# Patient Record
Sex: Female | Born: 1960 | Race: White | Hispanic: No | Marital: Married | Smoking: Current some day smoker
Health system: Southern US, Community
[De-identification: ages and names within clinical notes are randomized; demographics above are authoritative.]

## PROBLEM LIST (undated history)

## (undated) DIAGNOSIS — F909 Attention-deficit hyperactivity disorder, unspecified type: Secondary | ICD-10-CM

## (undated) DIAGNOSIS — M549 Dorsalgia, unspecified: Secondary | ICD-10-CM

## (undated) DIAGNOSIS — I10 Essential (primary) hypertension: Secondary | ICD-10-CM

## (undated) DIAGNOSIS — F319 Bipolar disorder, unspecified: Secondary | ICD-10-CM

## (undated) DIAGNOSIS — M1990 Unspecified osteoarthritis, unspecified site: Secondary | ICD-10-CM

## (undated) DIAGNOSIS — F419 Anxiety disorder, unspecified: Secondary | ICD-10-CM

## (undated) DIAGNOSIS — F32A Depression, unspecified: Secondary | ICD-10-CM

## (undated) DIAGNOSIS — F329 Major depressive disorder, single episode, unspecified: Secondary | ICD-10-CM

## (undated) DIAGNOSIS — G8929 Other chronic pain: Secondary | ICD-10-CM

## (undated) DIAGNOSIS — E7800 Pure hypercholesterolemia, unspecified: Secondary | ICD-10-CM

## (undated) DIAGNOSIS — J209 Acute bronchitis, unspecified: Secondary | ICD-10-CM

## (undated) HISTORY — PX: TUBAL LIGATION: SHX77

## (undated) HISTORY — PX: ARCUATE KERATECTOMY: SHX212

## (undated) HISTORY — PX: SPINAL CORD STIMULATOR IMPLANT: SHX2422

## (undated) HISTORY — PX: APPENDECTOMY: SHX54

---

## 1998-12-28 ENCOUNTER — Other Ambulatory Visit: Admission: RE | Admit: 1998-12-28 | Discharge: 1998-12-28 | Payer: Self-pay | Admitting: Obstetrics and Gynecology

## 2000-01-29 ENCOUNTER — Ambulatory Visit (HOSPITAL_COMMUNITY): Admission: RE | Admit: 2000-01-29 | Discharge: 2000-01-29 | Payer: Self-pay | Admitting: Family Medicine

## 2000-01-29 ENCOUNTER — Encounter: Payer: Self-pay | Admitting: Family Medicine

## 2001-03-06 ENCOUNTER — Other Ambulatory Visit: Admission: RE | Admit: 2001-03-06 | Discharge: 2001-03-06 | Payer: Self-pay | Admitting: Obstetrics and Gynecology

## 2002-11-20 ENCOUNTER — Other Ambulatory Visit: Admission: RE | Admit: 2002-11-20 | Discharge: 2002-11-20 | Payer: Self-pay | Admitting: Obstetrics and Gynecology

## 2004-02-07 ENCOUNTER — Other Ambulatory Visit: Admission: RE | Admit: 2004-02-07 | Discharge: 2004-02-07 | Payer: Self-pay | Admitting: Obstetrics and Gynecology

## 2005-08-03 ENCOUNTER — Other Ambulatory Visit: Admission: RE | Admit: 2005-08-03 | Discharge: 2005-08-03 | Payer: Self-pay | Admitting: Obstetrics and Gynecology

## 2008-12-10 HISTORY — PX: BACK SURGERY: SHX140

## 2009-02-02 ENCOUNTER — Encounter: Admission: RE | Admit: 2009-02-02 | Discharge: 2009-02-02 | Payer: Self-pay | Admitting: Family Medicine

## 2009-06-20 ENCOUNTER — Inpatient Hospital Stay (HOSPITAL_COMMUNITY): Admission: RE | Admit: 2009-06-20 | Discharge: 2009-06-23 | Payer: Self-pay | Admitting: Neurological Surgery

## 2009-06-20 ENCOUNTER — Ambulatory Visit: Payer: Self-pay | Admitting: Vascular Surgery

## 2011-01-25 ENCOUNTER — Emergency Department (HOSPITAL_COMMUNITY): Payer: 59

## 2011-01-25 ENCOUNTER — Observation Stay (HOSPITAL_COMMUNITY)
Admission: EM | Admit: 2011-01-25 | Discharge: 2011-01-26 | Disposition: A | Discharge status: Home / Self Care | Payer: 59 | Attending: Internal Medicine | Admitting: Internal Medicine

## 2011-01-25 DIAGNOSIS — E785 Hyperlipidemia, unspecified: Secondary | ICD-10-CM | POA: Insufficient documentation

## 2011-01-25 DIAGNOSIS — R079 Chest pain, unspecified: Secondary | ICD-10-CM

## 2011-01-25 DIAGNOSIS — E669 Obesity, unspecified: Secondary | ICD-10-CM | POA: Insufficient documentation

## 2011-01-25 DIAGNOSIS — F411 Generalized anxiety disorder: Secondary | ICD-10-CM | POA: Insufficient documentation

## 2011-01-25 DIAGNOSIS — R110 Nausea: Secondary | ICD-10-CM | POA: Insufficient documentation

## 2011-01-25 DIAGNOSIS — F17200 Nicotine dependence, unspecified, uncomplicated: Secondary | ICD-10-CM | POA: Insufficient documentation

## 2011-01-25 DIAGNOSIS — R0789 Other chest pain: Principal | ICD-10-CM | POA: Insufficient documentation

## 2011-01-25 DIAGNOSIS — F319 Bipolar disorder, unspecified: Secondary | ICD-10-CM | POA: Insufficient documentation

## 2011-01-25 LAB — COMPREHENSIVE METABOLIC PANEL
ALT: 14 U/L (ref 0–35)
AST: 16 U/L (ref 0–37)
Calcium: 9.2 mg/dL (ref 8.4–10.5)
GFR calc Af Amer: >60 mL/min (ref >60)
Sodium: 138 mEq/L (ref 135–145)
Total Protein: 6.9 g/dL (ref 6.0–8.3)

## 2011-01-25 LAB — CBC
HCT: 40.8 % (ref 36.0–46.0)
Hemoglobin: 14.2 g/dL (ref 12.0–15.0)
WBC: 8.3 10*3/uL (ref 4.0–10.5)

## 2011-01-25 LAB — POCT CARDIAC MARKERS
CKMB, poc: <1 ng/mL — ABNORMAL LOW (ref 1.0–8.0)
Myoglobin, poc: 54.9 ng/mL (ref 12–200)

## 2011-01-26 DIAGNOSIS — R079 Chest pain, unspecified: Secondary | ICD-10-CM

## 2011-01-26 LAB — CARDIAC PANEL(CRET KIN+CKTOT+MB+TROPI)
Relative Index: INVALID (ref 0.0–2.5)
Total CK: 108 U/L (ref 7–177)
Troponin I: <0.01 ng/mL (ref 0.00–0.06)

## 2011-01-26 LAB — LIPID PANEL
HDL: 35 mg/dL — ABNORMAL LOW (ref >39)
Total CHOL/HDL Ratio: 5 RATIO

## 2011-01-26 NOTE — H&P (Signed)
NAMEASMARA, BACKS                ACCOUNT NO.:  [id number]  MEDICAL RECORD NO.:  [id number]           PATIENT TYPE:  I  LOCATION:  6526                         FACILITY:  [facility]  PHYSICIAN:  Zacarias Pontes, MD       DATE OF BIRTH:  1961/05/11  DATE OF ADMISSION:  01/25/2011 DATE OF DISCHARGE:                             HISTORY & PHYSICAL   PRIMARY CARDIOLOGIST:  None.  PRIMARY CARE PHYSICIAN:  Dr. Laurann Montana.  CHIEF COMPLAINT:  Chest pain.  HISTORY OF PRESENT ILLNESS:  Melinda Obrien is a pleasant 50 year old female with ongoing tobacco use and pharmacologically controlled bipolar disorder who presents with a 2-3 month history of chest discomfort unrelated to exertion with a worsening today that did not improve for the last several months.  Melinda Obrien describes a chest pain syndrome which has repeatedly awoken her from sleep at night. Prior to today, her symptoms had improved with getting out of bed and ambulating for a certain period of time.  She reports being able to walk on the treadmill for 30 minutes without limitation and she reports the ability to be active when she wants to be.  Her last stint on the treadmill was approximately 2 weeks ago.  She denies any orthopnea or PND.  At approximately 3:00 a.m. on the morning of January 25, 2011,  she woke with central chest tightness.  She sat up and tried to get up to walk it off without success.  She reports having taken an oral benzodiazepine and subsequently falling asleep until around 7:00 a.m..  At around 7:00 a.m. she woke up again with the same tightness which never fully improved over the course of her day.  She denies any associated nausea or diaphoresis.  Her chest pain was unrelated to exertion.  Because of its persistence she presented to the [facility] emergency department for further evaluation.  PAST MEDICAL HISTORY: 1. Anxiety 2. Bipolar disorder managed medically.  She sees a psychiatrist    regularly with whom she has a reportedly good and productive     relationship.  FAMILY HISTORY:  Her father had coronary artery disease late in his life.  Her siblings have hypertension.  No reported history of premature coronary artery disease.  SOCIAL HISTORY:  The patient is married and accompanied by her husband. She is a long-time smoker and continues to actively smoke.  She denies alcohol or illicits.  She is still having regular menstrual periods and cycles.  REVIEW OF SYSTEMS:  As per HPI, otherwise comprehensively unremarkable.  ALLERGIES:  PENICILLIN, SULFA, CODEINE.  MEDICATIONS: 1. Depakote 500 mg twice daily. 2. Ambien 10 mg nightly. 3. Alprazolam 0.5 mg as needed. 4. Vicodin for pain as needed. 5. Lamictal 200 mg daily.  PHYSICAL EXAMINATION:  A comprehensive cardiovascular physical exam was performed. VITAL SIGNS:  She is she was afebrile, blood pressure of 118/60 and a heart rate of 70.  She is breathing comfortably at a respiratory rate of 16, saturating 100% on room air. HEENT:  Notable for is supple neck with no masses or lymphadenopathy. Her JVP appear normal. CARDIAC:  Notable for normal S1, S2 with no murmurs, rubs, or gallops. CHEST:  Notable for lungs that were clear to auscultation bilaterally with good air movement and no crackles present. ABDOMINAL:  Notable for a soft, nontender, nondistended abdomen.  No abdominal bruits were appreciated. EXTREMITIES:  Warm, well-perfused without any lower extremity. NEUROLOGIC:  Notable for patient alert, oriented x3 with no detectable focal neurologic deficits.  EKG demonstrated normal sinus rhythm with no ST-segment changes.  She did have T-wave inversion in lead III and T-wave flattening in aVF.  LABORATORY EVALUATION:  Notable for a CBC which was normal with a white count of 8000, hematocrit of 40 and platelets of 254,000.  Sodium is 138, potassium is 4, chloride 104, bicarbonate 28, BUN 14,  creatinine 0.9 with a glucose of 94.  Her extended chemistries were normal.  First set of cardiac enzymes was normal with a troponin less than 0.05.  IMPRESSION:  This is a 50 year old female with tobacco as a risk factor for coronary artery disease presenting with an atypical chest pain syndrome with no electrocardiographic or enzymatic evidence of acute coronary syndrome.  Though her ongoing tobacco use does put her at risk, her clinical history is atypical and her preliminary assessment is reassuring against the possibility of an unstable syndrome. Nevertheless, the potential acceleration or worsening of her symptoms today warrant careful evaluation.  PLAN:  We will admit Melinda Obrien to our Telemetry floor, as she is now chest painfree.  We will complete her enzyme curve to exclude myocardial infarction. Once myocardial infarction has been completely excluded, further reassurance can be sought via stress testing to exclude any significant presence of inducible ischemia.  Given her reported exercise tolerance in the recent past, it would be worthwhile to stress her via exercise.  Adjunctive stress imaging via echocardiography or nuclear imaging would be reasonable.  Regardless of whether she has inducible ischemia on further assessment, she still stands the benefit from aggressive risk factor modification in the form of smoking cessation counseling, which I began with her.  Given the atypical nature of her chest pain syndrome, noncoronary possibilities must also be considered.  Given the tendency for her chest complaints to occur at night while in the supine position, together with her ongoing tobacco use and generally poor eating habits, this could represent reflux or GI discomfort.  We will treat her empirically with Maalox to see if this settles down her symptoms and potentially prevents her short-term recurrence.          ______________________________ Zacarias Pontes,  MD     DM/MEDQ  D:  01/26/2011  T:  01/26/2011  Job:  [id number] Electronically Signed by Zacarias Pontes MD on 01/26/2011 02:37:13 PM

## 2011-02-10 NOTE — Discharge Summary (Signed)
  Melinda Obrien, Melinda Obrien                ACCOUNT NO.:  [id number]  MEDICAL RECORD NO.:  [id number]           PATIENT TYPE:  I  LOCATION:  6526                         FACILITY:  [facility]  PHYSICIAN:  Melinda Obrien, M.D. DATE OF BIRTH:  September 15, 1961  DATE OF ADMISSION:  01/25/2011 DATE OF DISCHARGE:  01/26/2011                              DISCHARGE SUMMARY   PRIMARY CARE PROVIDER:  Stacie Acres. White, MD  DISCHARGE DIAGNOSIS:  Chest pain without objective evidence of ischemia.  SECONDARY DIAGNOSES: 1. Anxiety. 2. Bipolar disorder. 3. Ongoing tobacco abuse.  ALLERGIES:  PENICILLIN, SULFA, and CODEINE.  PROCEDURES:  Stress echocardiogram during which the patient exercised for total of 7 minutes and 15 seconds achieving a maximal heart rate of 155 beats per minute (91%).  Exercise was limited by leg pain.  ECG showed no acute changes, and echocardiogram imaging showed no evidence for new left ventricular regional wall motion abnormalities, therefore a normal study.  HISTORY OF PRESENT ILLNESS:  A 50 year old female with the above problem list who over a 2-2-month period has been having intermittent chest discomfort, unrelated to exertion that worsened on the day of admission prompting ER visit.  In the ED, ECG showed no acute changes while enzymes were negative.  She was admitted for evaluation.  HOSPITAL COURSE:  The patient ruled out for MI.  She underwent a stress echocardiogram this morning where she walked for 7 minutes and 15 seconds achieving a maximal heart rate of 155 beats per minute.  ECG remained normal and LV function was normal without new wall motion abnormalities.  There was no evidence of inducible stress at maximal exercise and the patient was felt stable for discharge today.  She has been counseled on the importance of smoking cessation.  DISCHARGE LABS:  Hemoglobin 14.2, hematocrit 40.8, WBC 8.3, platelets 254.  Sodium 138, potassium 4.0, chloride 104, CO2 of 28,  BUN 14, creatinine 0.96, glucose 94, total bilirubin 0.1, alkaline phosphatase 37, AST 16, ALT 14, total protein 6.9, albumin 4.3, calcium 9.2.  CK 86, MB 1.1, troponin-I less than 0.01.  Total cholesterol 174, triglycerides 159, HDL 35, LDL 107.  DISPOSITION:  The patient will be discharged home today in good condition.  FOLLOWUP PLANS AND APPOINTMENTS:  The patient is asked to follow up with Dr. Cliffton Asters as previously scheduled.  DISCHARGE MEDICATIONS: 1. Alprazolam 0.5 mg q.i.d. p.r.n. 2. Ambien 10 at bedtime. 3. Depakote ER 500 mg b.i.d. 4. Hydrocodone/APAP 5/500 mg q.12 h. p.r.n. 5. Lamictal 200 mg daily.  OUTSTANDING LAB AND STUDIES:  None.  DURATION OF DISCHARGE ENCOUNTER:  Forty minutes including physician time.     Melinda Obrien, ANP   ______________________________ Melinda Obrien, M.D.    CB/MEDQ  D:  01/26/2011  T:  01/27/2011  Job:  [id number]  cc:   Stacie Acres. Cliffton Asters, M.D.  Electronically Signed by Melinda Obrien ANP on 01/31/2011 02:06:40 PM Electronically Signed by Melinda Obrien M.D. on 02/10/2011 07:54:50 AM

## 2011-02-21 ENCOUNTER — Ambulatory Visit (HOSPITAL_COMMUNITY)
Admission: RE | Admit: 2011-02-21 | Discharge: 2011-02-21 | Disposition: A | Discharge status: Home / Self Care | Payer: 59 | Source: Ambulatory Visit | Attending: Family Medicine | Admitting: Family Medicine

## 2011-02-21 ENCOUNTER — Other Ambulatory Visit: Payer: Self-pay | Admitting: Family Medicine

## 2011-02-21 ENCOUNTER — Other Ambulatory Visit (HOSPITAL_COMMUNITY): Payer: Self-pay | Admitting: Family Medicine

## 2011-02-21 DIAGNOSIS — K7689 Other specified diseases of liver: Secondary | ICD-10-CM | POA: Insufficient documentation

## 2011-02-21 DIAGNOSIS — R1011 Right upper quadrant pain: Secondary | ICD-10-CM | POA: Insufficient documentation

## 2011-02-22 ENCOUNTER — Other Ambulatory Visit: Payer: 59

## 2011-03-18 LAB — TYPE AND SCREEN: ABO/RH(D): A POS

## 2011-03-18 LAB — CBC
HCT: 38.8 % (ref 36.0–46.0)
MCV: 93.9 fL (ref 78.0–100.0)
RBC: 4.14 MIL/uL (ref 3.87–5.11)
WBC: 6.8 10*3/uL (ref 4.0–10.5)

## 2011-03-18 LAB — ABO/RH: ABO/RH(D): A POS

## 2011-04-24 NOTE — Op Note (Signed)
Melinda Obrien, GARMANY                ACCOUNT NO.:  [id number]   MEDICAL RECORD NO.:  [id number]          PATIENT TYPE:  INP   LOCATION:  3010                         FACILITY:  [facility]   PHYSICIAN:  Larina Earthly, M.D.    DATE OF BIRTH:  Feb 25, 1961   DATE OF PROCEDURE:  06/20/2009  DATE OF DISCHARGE:                               OPERATIVE REPORT   PREOPERATIVE DIAGNOSIS:  L4-5 degenerative disk disease.   POSTOPERATIVE DIAGNOSIS:  L4-5 degenerative disk disease.   PROCEDURE:  Anterior exposure for L4-5 disk interbody fusion which will  be dictated as a separate not by Dr. Barnett Abu.   SURGEON:  Larina Earthly, MD   ANESTHESIA:  Endotracheal.   COMPLICATIONS:  None.   DISPOSITION:  To recovery room, stable.   PROCEDURE IN DETAIL:  The patient was taken to the operating room and  placed in supine position where the C-arm was used to mark the level of  L4-5 externally.  The patient was prepped and draped in the usual  sterile fashion.  A transverse incision was made to the left of midline  over the level of L4-5.  The anterior rectus sheath was opened in line  with skin incision and the rectus muscle was mobilized.  Retroperitoneum  was entered laterally and the peritoneal contents were mobilized to the  right.  So, I was quite adherent to the anterior wall and there was  opening of the peritoneum over a moderate segment this was not closed at  the end of the procedure.  The ureter was identified and was mobilized  to the right.  The iliac vessels were identified and the left iliac vein  was mobilized.  The iliolumbar vein was ligated with 2-0 silk ties and  divided to give mobilization.  The iliac vessels were mobilized to the  right as well to give exposure to the L4-5 interbody.  The Brau  retractor was brought into place and the 150 blades were placed medially  and laterally at L4-5 for exposure as well as malleable retractors for  the proximal and distal exposure.  The  remainder of the operation  regarding interbody fusion was dictated as a separate note by Dr.  Danielle Dess.  At the completion of the fusion, the abdominal contents were  returned to their normal position.  There was no evidence of injury to  the artery or veins.  The anterior fascia was closed with running 0  Vicryl suture.  The wounds were irrigated with saline.  The skin was  closed with 3-0 subcuticular Vicryl stitch and Dermabond was placed on  the wound.  The patient tolerated the procedure without immediate  complications and was transferred to the recovery room in stable  condition.      Larina Earthly, M.D.  Electronically Signed    TFE/MEDQ  D:  06/23/2009  T:  06/23/2009  Job:  [id number]

## 2011-04-24 NOTE — Op Note (Signed)
NAMEKIMMERLY, Melinda Obrien                ACCOUNT NO.:  [id number]   MEDICAL RECORD NO.:  [id number]          PATIENT TYPE:  INP   LOCATION:  3010                         FACILITY:  [facility]   PHYSICIAN:  Stefani Dama, M.D.  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   POSTOPERATIVE DIAGNOSIS:  Spondylosis at L4-L5 with back pain and lumbar  radiculopathy.   PROCEDURE:  Anterior lumbar interbody decompression and fusion with PEEK  spacer, allograft bone, and Titanium plate fixation.   SURGEON:  Stefani Dama, MD   APPROACH AND CLOSURE:  Larina Earthly, MD, co-surgeon.   INDICATIONS:  Melinda Obrien is a 50 year old individual who has had  significant back pain with radicular pain.  She has advanced  degenerative changes at L4-L5 with marked spondylitic degeneration and  some bony overgrowth.  She was advised regarding surgical decompression  having failed efforts for prolonged period of time at conservative  measures.   PROCEDURE:  The patient was brought to the operating room and placed on  table in the supine position.  After smooth induction of general  endotracheal anesthesia, the abdomen was prepped with alcohol and  DuraPrep and draped in a sterile fashion.  Dr. Tawanna Cooler Early then completed  the approach to the retroperitoneal space exposing L4-5 which had been  localized positively with radiography.  Once the retractors were in  place, I started my portion of procedure by opening the anterior  longitudinal ligament with a #15 blade and using a combination of  Kerrison rongeurs to evacuate a significant quantity of severely  degenerating desiccated disk material.  As the disk space was evacuated,  the region of the posterior longitudinal ligament came into view.  Here,  osteophyte from the inferior  margin body of L4 was identified and this  was taken up with a 2-mm Kerrison punch.  A high-speed drill was then  used to rongeur smooth the edges of the bone to flatten the endplates.  We initially planned on using LDR instrumentation and then LDR implant  measuring 12 mm in height was chosen with 12 degrees of lordosis.  However, on attempts to place the anterior Titanium plate in the  superior portion of body of L4, we noticed that the plate would advance  only so far and then cracked the ventral aspect of the implant.  This  happened a total of 3 times before it was decided that this procedure  would not be successful.  I then removed the LDR implant and chose an  appropriately size and shaped Synthes SynFix implant.  The implant  measured 12.5 mm in height, 26 x 32 mm in size and had 8 degrees of  lordosis.  The implant fitted well and then 20-mm self-tapping screws  were used to secure the implant into the vertebral body of L4 and the  vertebral body of L5 with the anterior plate fixation to stabilize it.  Once this was completed, the wound was inspected for hemostasis.  The  lateral gutters were packed with a  combination of the Vitoss and Infuse, which had been prepared to place  into the interspace itself and then retractors were removed and the  ventral closure was performed by Dr. Arbie Cookey.  The patient tolerated the  procedure well.  She was returned to the recovery room in stable  condition.  Blood loss was noted to be less than 100 mL.      Stefani Dama, M.D.  Electronically Signed     HJE/MEDQ  D:  07/07/2009  T:  07/07/2009  Job:  [id number]

## 2011-04-27 NOTE — Discharge Summary (Signed)
NAMEKAYANN, MAJ                ACCOUNT NO.:  [id number]   MEDICAL RECORD NO.:  [id number]          PATIENT TYPE:  INP   LOCATION:  3010                         FACILITY:  [facility]   PHYSICIAN:  Stefani Dama, M.D.  DATE OF BIRTH:  1961/10/09   DATE OF ADMISSION:  06/20/2009  DATE OF DISCHARGE:  06/23/2009                               DISCHARGE SUMMARY   ADMITTING DIAGNOSES:  1. Degenerative disk disease and lumbar spondylosis, L4-5.  2. Low back pain.  3. Lower extremity radiculopathy.   OPERATIONS AND PROCEDURES:  Anterior lumbar interbody fusion and  decompression, L4-5.   BRIEF HISTORY AND HOSPITAL COURSE:  Terence Googe is a 50 year old female  with longstanding centralized low back pain with one level of L4-5  degenerative disk disease who failed conservative care.  Her centralized  low back pain and lower extremity pain continued and she elected to  proceed with surgical intervention.  She underwent anterior lumbar  interbody fusion and decompression on June 20, 2009, tolerated the  procedure well, stabilized in recovery room and placed in neurosurgical  ICU postoperatively.  Vital signs remained stable throughout her  hospitalization.  First day postoperatively, the patient was out of bed  and chair, comfortable.  Positive bowel sounds.  Neurovascularly intact.  Distal pulses intact.  Wound benign, no erythema or any signs of  infection.  Motor sensation within normal limits.  Abdomen, soft.  Her a-  line and central line were discontinued.  PCA morphine pump was  discontinued and saline lock started on p.o. Percocet.  Transfer  arrangements were made to transfer her to 3000.  She was started on  Lovenox prophylaxis against DVT 40 mg subcu daily, and Dulcolax  suppository.   DISCHARGE PLANNING:  Her diet was advanced and she was localizing safely  in the halls.  Second day postoperatively, she continued to make good  progress, was voiding well, ambulating safely, and  no leg pain, eating  well.  Wound remained benign.  Arrangements were made for discharge home  on June 23, 2009.  Lovenox home teaching was arranged to work with  physical therapy while at time on bed mobility and safe ambulation.  She  is ready for discharge home on June 23, 2009, eating well and voiding  well.  Ambulating safely.  Neurovascularly intact.   DISCHARGE INSTRUCTIONS:  Discharge home.  Back precautions Aspen  QuickDraw brace.  Lovenox 40 mg subcu daily x7 days.   FOLLOWUP:  Follow up with Dr. Danielle Dess in 3-4 weeks.   MEDICATIONS:  Continue on home medications of:  1. Lamotrigine 200 mg daily.  2. Neurontin 300 mg t.i.d.  3. Alprazolam 0.5 mg q.i.d.  4. On Depakote ER 500 mg 2-3 tablets at bedtime.  5. Ambien 10 mg at bedtime p.r.n. sleep.  6. She is given a prescription for Percocet 1-2 p.o. q.4-6 h. p.r.n.      pain.  7. Valium 5 mg 1 p.o. q.8-12 h. p.r.n. muscle spasm.  8. Lovenox 40 mg subcu daily x7 days.   DISCHARGE CONDITION:  Stable, improved.   DISPOSITION:  Discharge  to home.  Contact our office prior to follow up  if any questions or concerns.      Aura Fey Bobbe Medico.      Stefani Dama, M.D.  Electronically Signed    SCI/MEDQ  D:  07/27/2009  T:  07/27/2009  Job:  [id number]

## 2011-07-25 ENCOUNTER — Other Ambulatory Visit (HOSPITAL_COMMUNITY): Payer: Self-pay | Admitting: Family Medicine

## 2011-07-25 DIAGNOSIS — K7689 Other specified diseases of liver: Secondary | ICD-10-CM

## 2011-08-21 ENCOUNTER — Other Ambulatory Visit (HOSPITAL_COMMUNITY): Payer: 59

## 2011-08-23 ENCOUNTER — Other Ambulatory Visit: Payer: Self-pay | Admitting: Orthopedic Surgery

## 2011-08-23 DIAGNOSIS — M545 Low back pain: Secondary | ICD-10-CM

## 2011-09-10 ENCOUNTER — Ambulatory Visit
Admission: RE | Admit: 2011-09-10 | Discharge: 2011-09-10 | Disposition: A | Discharge status: Home / Self Care | Payer: 59 | Source: Ambulatory Visit | Attending: Orthopedic Surgery | Admitting: Orthopedic Surgery

## 2011-09-10 DIAGNOSIS — M545 Low back pain: Secondary | ICD-10-CM

## 2011-09-10 MED ORDER — IOHEXOL 180 MG/ML  SOLN
18.0000 mL | Freq: Once | INTRAMUSCULAR | Status: AC | PRN
  Administered 2011-09-10: 18 mL via INTRATHECAL

## 2011-09-10 MED ORDER — DIAZEPAM 2 MG PO TABS
10.0000 mg | ORAL_TABLET | Freq: Once | ORAL | Status: AC
  Administered 2011-09-10: 10 mg via ORAL

## 2011-12-20 ENCOUNTER — Emergency Department (HOSPITAL_COMMUNITY)
Admission: EM | Admit: 2011-12-20 | Discharge: 2011-12-20 | Discharge status: Against Medical Advice | Payer: 59 | Attending: Emergency Medicine | Admitting: Emergency Medicine

## 2011-12-20 ENCOUNTER — Encounter (HOSPITAL_COMMUNITY): Payer: Self-pay | Admitting: *Deleted

## 2011-12-20 DIAGNOSIS — IMO0001 Reserved for inherently not codable concepts without codable children: Secondary | ICD-10-CM | POA: Insufficient documentation

## 2011-12-20 DIAGNOSIS — M545 Low back pain, unspecified: Secondary | ICD-10-CM | POA: Insufficient documentation

## 2011-12-20 DIAGNOSIS — M25469 Effusion, unspecified knee: Secondary | ICD-10-CM | POA: Insufficient documentation

## 2011-12-20 DIAGNOSIS — M25569 Pain in unspecified knee: Secondary | ICD-10-CM | POA: Insufficient documentation

## 2011-12-20 DIAGNOSIS — R209 Unspecified disturbances of skin sensation: Secondary | ICD-10-CM | POA: Insufficient documentation

## 2011-12-20 DIAGNOSIS — R5381 Other malaise: Secondary | ICD-10-CM | POA: Insufficient documentation

## 2011-12-20 HISTORY — DX: Other chronic pain: G89.29

## 2011-12-20 HISTORY — DX: Dorsalgia, unspecified: M54.9

## 2011-12-20 HISTORY — DX: Acute bronchitis, unspecified: J20.9

## 2011-12-20 NOTE — ED Notes (Signed)
Pt resting on stretcher; given a snack per her request.

## 2011-12-20 NOTE — ED Notes (Signed)
Pt unable to get pain under control; seen by ortho MD for sciatica; prescription vicodin not working tonight. Pt reports feels like pain is moving down to knee.

## 2011-12-20 NOTE — ED Notes (Signed)
PT reported she is leaving; she will go home and see her MD who opens at 0900. PT reports her pain is an emergency and walked out of ED. Pt ambulated with a steady gait. A&Ox3; respirations even and unlabored; skin warm and dry.

## 2011-12-20 NOTE — ED Notes (Signed)
H/o chronic back pain, h/o lumbar fusion, pain onset in November, intermitant & gradually progressively worse, worse in last week, wakes me up from sleep. Also reports weakness, numbness, tingling. pinponts pain to L buttocks with radiation down leg, also significant knee pain with swelling. Took 2 - 10mg  vicodin earlier.

## 2012-06-04 ENCOUNTER — Other Ambulatory Visit: Payer: Self-pay | Admitting: Family Medicine

## 2012-06-05 ENCOUNTER — Ambulatory Visit
Admission: RE | Admit: 2012-06-05 | Discharge: 2012-06-05 | Disposition: A | Discharge status: Home / Self Care | Payer: 59 | Source: Ambulatory Visit | Attending: Family Medicine | Admitting: Family Medicine

## 2013-09-24 ENCOUNTER — Other Ambulatory Visit (HOSPITAL_COMMUNITY): Payer: Self-pay | Admitting: Physical Medicine and Rehabilitation

## 2013-09-24 ENCOUNTER — Ambulatory Visit (HOSPITAL_COMMUNITY)
Admission: RE | Admit: 2013-09-24 | Discharge: 2013-09-24 | Disposition: A | Discharge status: Home / Self Care | Payer: 59 | Source: Ambulatory Visit | Attending: Physical Medicine and Rehabilitation | Admitting: Physical Medicine and Rehabilitation

## 2013-09-24 DIAGNOSIS — R52 Pain, unspecified: Secondary | ICD-10-CM

## 2013-09-24 DIAGNOSIS — M898X9 Other specified disorders of bone, unspecified site: Secondary | ICD-10-CM | POA: Insufficient documentation

## 2013-09-24 DIAGNOSIS — M25569 Pain in unspecified knee: Secondary | ICD-10-CM | POA: Insufficient documentation

## 2013-09-24 DIAGNOSIS — M25469 Effusion, unspecified knee: Secondary | ICD-10-CM | POA: Insufficient documentation

## 2013-09-24 DIAGNOSIS — W19XXXA Unspecified fall, initial encounter: Secondary | ICD-10-CM | POA: Insufficient documentation

## 2013-10-01 ENCOUNTER — Other Ambulatory Visit: Payer: Self-pay | Admitting: Physical Medicine and Rehabilitation

## 2013-10-01 DIAGNOSIS — M25561 Pain in right knee: Secondary | ICD-10-CM

## 2013-10-07 ENCOUNTER — Other Ambulatory Visit: Payer: Self-pay

## 2013-10-09 ENCOUNTER — Ambulatory Visit
Admission: RE | Admit: 2013-10-09 | Discharge: 2013-10-09 | Disposition: A | Discharge status: Home / Self Care | Payer: 59 | Source: Ambulatory Visit | Attending: Physical Medicine and Rehabilitation | Admitting: Physical Medicine and Rehabilitation

## 2013-10-09 DIAGNOSIS — M25561 Pain in right knee: Secondary | ICD-10-CM

## 2013-10-22 ENCOUNTER — Other Ambulatory Visit: Payer: Self-pay | Admitting: Orthopedic Surgery

## 2013-10-22 ENCOUNTER — Encounter (HOSPITAL_BASED_OUTPATIENT_CLINIC_OR_DEPARTMENT_OTHER): Payer: Self-pay | Admitting: *Deleted

## 2013-10-22 NOTE — Progress Notes (Signed)
No cardiac or resp problems-does smoke-no labs needed

## 2013-10-22 NOTE — H&P (Signed)
Melinda Obrien is an 52 y.o. female.   Chief Complaint: Right knee pain and swelling  HPI: Patient has been seen in the past for chronic low back pain after spine fusion in 2010 on chronic pain management.  Starting a few months ago she had significant and severe pain and right greater than left knee had an injection of cortisone into each knee 2 months ago that worked well on the left and failed on the right.  The right has persistent effusion, pain with full extension as well as flexion and is tender along the lateral joint line.  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  The pain interferes with sleep and affects her ability to do chores around the house.  She is seen today for consideration of arthroscopic evaluation.  Past Medical History  Diagnosis Date  . Bronchitis, acute   . Back pain, chronic   . Bipolar 1 disorder   . Anxiety   . Depression   . Arthritis     Past Surgical History  Procedure Laterality Date  . Back surgery  2010    lumb fusion  . Tubal ligation    . Appendectomy    . Arcuate keratectomy      Family History  Problem Relation Age of Onset  . Heart failure Father   . Cancer Other   . Heart failure Other    Social History:  reports that she has been smoking.  She does not have any smokeless tobacco history on file. She reports that she does not drink alcohol or use illicit drugs.  Allergies:  Allergies  Allergen Reactions  . Amoxicillin Hives  . Penicillins Hives  . Sulfa Antibiotics Itching    No prescriptions prior to admission    No results found for this or any previous visit (from the past 48 hour(s)). No results found.  Review of Systems  Constitutional: Negative.   HENT: Negative.   Eyes: Negative.   Respiratory: Negative.   Cardiovascular: Negative.   Gastrointestinal: Negative.    Genitourinary: Negative.   Musculoskeletal: Positive for joint pain.  Skin: Negative.   Neurological: Negative.   Endo/Heme/Allergies: Negative.   Psychiatric/Behavioral: Negative.     Height 5\' 5"  (1.651 m), weight 95.255 kg (210 lb), last menstrual period 12/06/2011. Physical Exam  Constitutional: She is oriented to person, place, and time. She appears well-developed and well-nourished.  HENT:  Head: Normocephalic and atraumatic.  Eyes: Pupils are equal, round, and reactive to light.  Neck: Normal range of motion. Neck supple.  Cardiovascular: Intact distal pulses.   Respiratory: Effort normal.  Musculoskeletal:  2+ effusion, range of motion is 10-85 with pain at extremes.  Tender along the lateral joint line, less so along the medial joint line.  Valgus stress exacerbates pain.  Varus stress ameliorates pain.  Collateral ligaments are stable Lachman's test is negative.    Neurological: She is alert and oriented to person, place, and time.  Skin: Skin is warm and dry.  Psychiatric: She has a normal mood and affect. Her behavior is normal. Judgment and thought content normal.     Assessment/Plan Assess: 2-1/2 month history of symptomatic lateral meniscal tear and chondromalacia, with effusion despite conservative treatment including anti-inflammatory medicines, exercises, and cortisone injection that failed early on the right but worked on the left  Plan: Risks and benefits of arthroscopic surgery  discussed with the patient and all questions answered.  We will get her set up for lateral meniscectomy and debridement of chondromalacia.  All see her back at the time of surgical intervention.  There are no contraindications to outpatient surgery.  Handout for arthroscopic surgery given to patient.  Deena Shaub R 10/22/2013, 6:08 PM

## 2013-10-26 ENCOUNTER — Encounter (HOSPITAL_BASED_OUTPATIENT_CLINIC_OR_DEPARTMENT_OTHER): Payer: 59 | Admitting: Anesthesiology

## 2013-10-26 ENCOUNTER — Encounter (HOSPITAL_BASED_OUTPATIENT_CLINIC_OR_DEPARTMENT_OTHER): Payer: Self-pay

## 2013-10-26 ENCOUNTER — Ambulatory Visit (HOSPITAL_BASED_OUTPATIENT_CLINIC_OR_DEPARTMENT_OTHER)
Admission: RE | Admit: 2013-10-26 | Discharge: 2013-10-26 | Disposition: A | Discharge status: Home / Self Care | Payer: 59 | Source: Ambulatory Visit | Attending: Orthopedic Surgery | Admitting: Orthopedic Surgery

## 2013-10-26 ENCOUNTER — Ambulatory Visit (HOSPITAL_BASED_OUTPATIENT_CLINIC_OR_DEPARTMENT_OTHER): Payer: 59 | Admitting: Anesthesiology

## 2013-10-26 ENCOUNTER — Encounter (HOSPITAL_BASED_OUTPATIENT_CLINIC_OR_DEPARTMENT_OTHER)
Admission: RE | Disposition: A | Discharge status: Home / Self Care | Payer: Self-pay | Source: Ambulatory Visit | Attending: Orthopedic Surgery

## 2013-10-26 DIAGNOSIS — M25469 Effusion, unspecified knee: Secondary | ICD-10-CM | POA: Insufficient documentation

## 2013-10-26 DIAGNOSIS — G8929 Other chronic pain: Secondary | ICD-10-CM | POA: Insufficient documentation

## 2013-10-26 DIAGNOSIS — M23349 Other meniscus derangements, anterior horn of lateral meniscus, unspecified knee: Secondary | ICD-10-CM | POA: Insufficient documentation

## 2013-10-26 DIAGNOSIS — M545 Low back pain, unspecified: Secondary | ICD-10-CM | POA: Insufficient documentation

## 2013-10-26 DIAGNOSIS — F319 Bipolar disorder, unspecified: Secondary | ICD-10-CM | POA: Insufficient documentation

## 2013-10-26 DIAGNOSIS — S83281A Other tear of lateral meniscus, current injury, right knee, initial encounter: Secondary | ICD-10-CM

## 2013-10-26 DIAGNOSIS — F17200 Nicotine dependence, unspecified, uncomplicated: Secondary | ICD-10-CM | POA: Insufficient documentation

## 2013-10-26 DIAGNOSIS — S83242A Other tear of medial meniscus, current injury, left knee, initial encounter: Secondary | ICD-10-CM

## 2013-10-26 DIAGNOSIS — M23329 Other meniscus derangements, posterior horn of medial meniscus, unspecified knee: Secondary | ICD-10-CM | POA: Insufficient documentation

## 2013-10-26 DIAGNOSIS — M2240 Chondromalacia patellae, unspecified knee: Secondary | ICD-10-CM | POA: Insufficient documentation

## 2013-10-26 HISTORY — PX: KNEE ARTHROSCOPY WITH LATERAL MENISECTOMY: SHX6193

## 2013-10-26 HISTORY — DX: Bipolar disorder, unspecified: F31.9

## 2013-10-26 HISTORY — DX: Unspecified osteoarthritis, unspecified site: M19.90

## 2013-10-26 HISTORY — DX: Major depressive disorder, single episode, unspecified: F32.9

## 2013-10-26 HISTORY — DX: Depression, unspecified: F32.A

## 2013-10-26 HISTORY — DX: Anxiety disorder, unspecified: F41.9

## 2013-10-26 LAB — POCT HEMOGLOBIN-HEMACUE: Hemoglobin: 13.8 g/dL (ref 12.0–15.0)

## 2013-10-26 SURGERY — ARTHROSCOPY, KNEE, WITH LATERAL MENISCECTOMY
Anesthesia: General | Site: Knee | Laterality: Right | Wound class: Clean

## 2013-10-26 MED ORDER — HYDROCODONE-ACETAMINOPHEN 10-325 MG PO TABS: 1.0000 | ORAL_TABLET | Freq: Four times a day (QID) | ORAL | Status: DC | PRN

## 2013-10-26 MED ORDER — BUPIVACAINE HCL (PF) 0.5 % IJ SOLN: INTRAMUSCULAR | Status: DC | PRN

## 2013-10-26 MED ORDER — BUPIVACAINE-EPINEPHRINE 0.5% -1:200000 IJ SOLN
INTRAMUSCULAR | Status: DC | PRN
  Administered 2013-10-26: 20 mL

## 2013-10-26 MED ORDER — HYDROMORPHONE HCL PF 1 MG/ML IJ SOLN
0.2500 mg | INTRAMUSCULAR | Status: DC | PRN
  Administered 2013-10-26: 0.5 mg via INTRAVENOUS

## 2013-10-26 MED ORDER — LIDOCAINE HCL (CARDIAC) 20 MG/ML IV SOLN
INTRAVENOUS | Status: DC | PRN
  Administered 2013-10-26: 60 mg via INTRAVENOUS

## 2013-10-26 MED ORDER — METOCLOPRAMIDE HCL 5 MG/ML IJ SOLN: 5.0000 mg | Freq: Three times a day (TID) | INTRAMUSCULAR | Status: DC | PRN

## 2013-10-26 MED ORDER — EPINEPHRINE HCL 1 MG/ML IJ SOLN
INTRAMUSCULAR | Status: AC
  Filled 2013-10-26: qty 1

## 2013-10-26 MED ORDER — VANCOMYCIN HCL IN DEXTROSE 1-5 GM/200ML-% IV SOLN
1000.0000 mg | INTRAVENOUS | Status: AC
  Administered 2013-10-26: 1000 mg via INTRAVENOUS

## 2013-10-26 MED ORDER — PROPOFOL 10 MG/ML IV BOLUS
INTRAVENOUS | Status: DC | PRN
  Administered 2013-10-26: 160 mg via INTRAVENOUS

## 2013-10-26 MED ORDER — OXYCODONE HCL 5 MG PO TABS: 5.0000 mg | ORAL_TABLET | Freq: Once | ORAL | Status: DC | PRN

## 2013-10-26 MED ORDER — MIDAZOLAM HCL 2 MG/2ML IJ SOLN
INTRAMUSCULAR | Status: AC
  Filled 2013-10-26: qty 2

## 2013-10-26 MED ORDER — LACTATED RINGERS IV SOLN
INTRAVENOUS | Status: DC
  Administered 2013-10-26: 13:00:00 via INTRAVENOUS

## 2013-10-26 MED ORDER — ONDANSETRON HCL 4 MG PO TABS: 4.0000 mg | ORAL_TABLET | Freq: Four times a day (QID) | ORAL | Status: DC | PRN

## 2013-10-26 MED ORDER — FENTANYL CITRATE 0.05 MG/ML IJ SOLN
INTRAMUSCULAR | Status: AC
  Filled 2013-10-26: qty 4

## 2013-10-26 MED ORDER — FENTANYL CITRATE 0.05 MG/ML IJ SOLN
INTRAMUSCULAR | Status: DC | PRN
  Administered 2013-10-26: 100 ug via INTRAVENOUS
  Administered 2013-10-26: 25 ug via INTRAVENOUS

## 2013-10-26 MED ORDER — MIDAZOLAM HCL 2 MG/2ML IJ SOLN: 1.0000 mg | INTRAMUSCULAR | Status: DC | PRN

## 2013-10-26 MED ORDER — FENTANYL CITRATE 0.05 MG/ML IJ SOLN: 50.0000 ug | INTRAMUSCULAR | Status: DC | PRN

## 2013-10-26 MED ORDER — KETOROLAC TROMETHAMINE 30 MG/ML IJ SOLN
INTRAMUSCULAR | Status: DC | PRN
  Administered 2013-10-26: 30 mg via INTRAVENOUS

## 2013-10-26 MED ORDER — METOCLOPRAMIDE HCL 5 MG PO TABS: 5.0000 mg | ORAL_TABLET | Freq: Three times a day (TID) | ORAL | Status: DC | PRN

## 2013-10-26 MED ORDER — ONDANSETRON HCL 4 MG/2ML IJ SOLN
INTRAMUSCULAR | Status: DC | PRN
  Administered 2013-10-26: 4 mg via INTRAVENOUS

## 2013-10-26 MED ORDER — EPINEPHRINE HCL 1 MG/ML IJ SOLN
INTRAMUSCULAR | Status: DC | PRN
  Administered 2013-10-26: 1 mg

## 2013-10-26 MED ORDER — OXYCODONE HCL 5 MG/5ML PO SOLN: 5.0000 mg | Freq: Once | ORAL | Status: DC | PRN

## 2013-10-26 MED ORDER — DEXAMETHASONE SODIUM PHOSPHATE 4 MG/ML IJ SOLN
INTRAMUSCULAR | Status: DC | PRN
  Administered 2013-10-26: 10 mg via INTRAVENOUS

## 2013-10-26 MED ORDER — HYDROMORPHONE HCL PF 1 MG/ML IJ SOLN
INTRAMUSCULAR | Status: AC
  Filled 2013-10-26: qty 1

## 2013-10-26 MED ORDER — VANCOMYCIN HCL IN DEXTROSE 1-5 GM/200ML-% IV SOLN
INTRAVENOUS | Status: AC
  Filled 2013-10-26: qty 200

## 2013-10-26 MED ORDER — BUPIVACAINE-EPINEPHRINE PF 0.5-1:200000 % IJ SOLN
INTRAMUSCULAR | Status: AC
  Filled 2013-10-26: qty 30

## 2013-10-26 MED ORDER — ONDANSETRON HCL 4 MG/2ML IJ SOLN: 4.0000 mg | Freq: Four times a day (QID) | INTRAMUSCULAR | Status: DC | PRN

## 2013-10-26 MED ORDER — MIDAZOLAM HCL 5 MG/5ML IJ SOLN
INTRAMUSCULAR | Status: DC | PRN
  Administered 2013-10-26: 2 mg via INTRAVENOUS

## 2013-10-26 MED ORDER — POVIDONE-IODINE 7.5 % EX SOLN: Freq: Once | CUTANEOUS | Status: DC

## 2013-10-26 MED ORDER — SODIUM CHLORIDE 0.9 % IR SOLN
Status: DC | PRN
  Administered 2013-10-26: 6000 mL

## 2013-10-26 SURGICAL SUPPLY — 38 items
BANDAGE ELASTIC 6 VELCRO ST LF (GAUZE/BANDAGES/DRESSINGS) ×2 IMPLANT
BLADE 4.2CUDA (BLADE) IMPLANT
BLADE CUTTER GATOR 3.5 (BLADE) ×2 IMPLANT
BLADE GREAT WHITE 4.2 (BLADE) IMPLANT
CANISTER SUCT 3000ML (MISCELLANEOUS) IMPLANT
CANISTER SUCT LVC 12 LTR MEDI- (MISCELLANEOUS) ×2 IMPLANT
CHLORAPREP W/TINT 26ML (MISCELLANEOUS) ×2 IMPLANT
DRAPE ARTHROSCOPY W/POUCH 114 (DRAPES) ×2 IMPLANT
ELECT MENISCUS 165MM 90D (ELECTRODE) IMPLANT
ELECT REM PT RETURN 9FT ADLT (ELECTROSURGICAL)
ELECTRODE REM PT RTRN 9FT ADLT (ELECTROSURGICAL) IMPLANT
GAUZE XEROFORM 1X8 LF (GAUZE/BANDAGES/DRESSINGS) ×2 IMPLANT
GLOVE BIO SURGEON STRL SZ7.5 (GLOVE) ×2 IMPLANT
GLOVE BIO SURGEON STRL SZ8.5 (GLOVE) ×2 IMPLANT
GLOVE BIOGEL PI IND STRL 8 (GLOVE) ×2 IMPLANT
GLOVE BIOGEL PI IND STRL 9 (GLOVE) ×1 IMPLANT
GLOVE BIOGEL PI INDICATOR 8 (GLOVE) ×2
GLOVE BIOGEL PI INDICATOR 9 (GLOVE) ×1
GLOVE ECLIPSE 6.5 STRL STRAW (GLOVE) ×2 IMPLANT
GOWN PREVENTION PLUS XLARGE (GOWN DISPOSABLE) ×4 IMPLANT
GOWN PREVENTION PLUS XXLARGE (GOWN DISPOSABLE) ×2 IMPLANT
IV NS IRRIG 3000ML ARTHROMATIC (IV SOLUTION) ×4 IMPLANT
KNEE WRAP E Z 3 GEL PACK (MISCELLANEOUS) ×2 IMPLANT
NDL SAFETY ECLIPSE 18X1.5 (NEEDLE) IMPLANT
NEEDLE HYPO 18GX1.5 SHARP (NEEDLE)
PACK ARTHROSCOPY DSU (CUSTOM PROCEDURE TRAY) ×2 IMPLANT
PACK BASIN DAY SURGERY FS (CUSTOM PROCEDURE TRAY) ×2 IMPLANT
PAD ALCOHOL SWAB (MISCELLANEOUS) IMPLANT
PENCIL BUTTON HOLSTER BLD 10FT (ELECTRODE) IMPLANT
SET ARTHROSCOPY TUBING (MISCELLANEOUS) ×1
SET ARTHROSCOPY TUBING LN (MISCELLANEOUS) ×1 IMPLANT
SLEEVE SCD COMPRESS KNEE MED (MISCELLANEOUS) IMPLANT
SPONGE GAUZE 4X4 12PLY (GAUZE/BANDAGES/DRESSINGS) ×2 IMPLANT
SYR 3ML 18GX1 1/2 (SYRINGE) IMPLANT
SYR 5ML LL (SYRINGE) IMPLANT
TOWEL OR 17X24 6PK STRL BLUE (TOWEL DISPOSABLE) ×2 IMPLANT
WAND STAR VAC 90 (SURGICAL WAND) IMPLANT
WATER STERILE IRR 1000ML POUR (IV SOLUTION) ×2 IMPLANT

## 2013-10-26 NOTE — Op Note (Signed)
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 Turner Daniels M.D.  Assist: Tomi Likens. Gaylene Brooks  (present throughout entire procedure and necessary for timely completion of the procedure) Anes: General LMA  EBL: Minimal  Fluids: 800 cc   Indications: Catching popping and pain in the right knee that interferes with activities and chores.. Pt has failed conservative treatment with anti-inflammatory medicines, physical therapy, and modified activites but did get good temporarily from an intra-articular cortisone injection. Pain has recurred and patient desires elective arthroscopic evaluation and treatment of knee. Risks and benefits of surgery have been discussed and questions answered.  Procedure: Patient identified by arm band and taken to the operating room at the day [facility]. The appropriate anesthetic monitors were attached, and General LMA anesthesia was induced without difficulty. Lateral post was applied to the table and the lower extremity was prepped and draped in usual sterile fashion from the ankle to the midthigh. Time out procedure was performed. We began the operation by making standard inferior lateral and inferior medial peripatellar portals with a #11 blade allowing introduction of the arthroscope through the inferior lateral portal and the out flow to the inferior medial portal. Pump pressure was set at 100 mmHg and diagnostic arthroscopy  revealed mild chondromalacia the patellofemoral joint lightly debrided near the apex of the patella. Moving into the medial compartment the posterior horn of medial meniscus was torn radial, and parrot-beak components. Debrided back to a stable  margin with a small straight biter and a 3.5 Gator sucker shaver. Anterior cruciate ligament and PCL are intact. Lateral compartment articular cartilage was in good condition anterior horn lateral meniscal tear parrot-beak again debrided back to a stable margin of 3.5 Gator sucker shaver.. The knee was irrigated out normal saline solution. A dressing of xerofoam 4 x 4 dressing sponges, web roll and an Ace wrap was applied. The patient was awakened extubated and taken to the recovery without difficulty.    Signed: [name], MD

## 2013-10-26 NOTE — Anesthesia Procedure Notes (Signed)
Procedure Name: LMA Insertion Performed by: Shadow Stiggers W Pre-anesthesia Checklist: Patient identified, Timeout performed, Emergency Drugs available, Suction available and Patient being monitored Patient Re-evaluated:Patient Re-evaluated prior to inductionOxygen Delivery Method: Circle system utilized Preoxygenation: Pre-oxygenation with 100% oxygen Intubation Type: IV induction Ventilation: Mask ventilation without difficulty LMA: LMA with gastric port inserted LMA Size: 4.0 Number of attempts: 1 Placement Confirmation: breath sounds checked- equal and bilateral and positive ETCO2 Tube secured with: Tape Dental Injury: Teeth and Oropharynx as per pre-operative assessment      

## 2013-10-26 NOTE — Interval H&P Note (Signed)
History and Physical Interval Note:  10/26/2013 1:02 PM  Melinda Obrien  has presented today for surgery, with the diagnosis of lateral meniscus tear and chondromalacia  The various methods of treatment have been discussed with the patient and family. After consideration of risks, benefits and other options for treatment, the patient has consented to  Procedure(s): KNEE ARTHROSCOPY WITH LATERAL MENISECTOMY (Right) as a surgical intervention .  The patient's history has been reviewed, patient examined, no change in status, stable for surgery.  I have reviewed the patient's chart and labs.  Questions were answered to the patient's satisfaction.     [name]

## 2013-10-26 NOTE — Anesthesia Postprocedure Evaluation (Signed)
  Anesthesia Post-op Note  Patient: Melinda Obrien  Procedure(s) Performed: Procedure(s) with comments: KNEE ARTHROSCOPY WITH LATERAL MENISECTOMY (Right) - Partial medial and lateral menisectomies and chondroplasty patella  Patient Location: PACU  Anesthesia Type:General  Level of Consciousness: awake and alert   Airway and Oxygen Therapy: Patient Spontanous Breathing  Post-op Pain: mild  Post-op Assessment: Post-op Vital signs reviewed, Patient's Cardiovascular Status Stable and Respiratory Function Stable  Post-op Vital Signs: Reviewed  Filed Vitals:   10/26/13 1424  BP:   Pulse: 99  Temp:   Resp: 15    Complications: No apparent anesthesia complications

## 2013-10-26 NOTE — Anesthesia Preprocedure Evaluation (Signed)
Anesthesia Evaluation  Patient identified by MRN, date of birth, ID band Patient awake    Reviewed: Allergy & Precautions, H&P , NPO status , Patient's Chart, lab work & pertinent test results  Airway Mallampati: III TM Distance: >3 FB Neck ROM: Full    Dental no notable dental hx. (+) Teeth Intact and Dental Advisory Given   Pulmonary Current Smoker,    Pulmonary exam normal       Cardiovascular negative cardio ROS      Neuro/Psych Anxiety Depression Bipolar Disorder negative neurological ROS     GI/Hepatic negative GI ROS, Neg liver ROS,   Endo/Other  negative endocrine ROS  Renal/GU negative Renal ROS  negative genitourinary   Musculoskeletal   Abdominal   Peds  Hematology negative hematology ROS (+)   Anesthesia Other Findings   Reproductive/Obstetrics negative OB ROS                           Anesthesia Physical Anesthesia Plan  ASA: II  Anesthesia Plan: General   Post-op Pain Management:    Induction: Intravenous  Airway Management Planned: LMA  Additional Equipment:   Intra-op Plan:   Post-operative Plan: Extubation in OR  Informed Consent: I have reviewed the patients History and Physical, chart, labs and discussed the procedure including the risks, benefits and alternatives for the proposed anesthesia with the patient or authorized representative who has indicated his/her understanding and acceptance.   Dental advisory given  Plan Discussed with: CRNA  Anesthesia Plan Comments:         Anesthesia Quick Evaluation

## 2013-10-26 NOTE — Transfer of Care (Signed)
Immediate Anesthesia Transfer of Care Note  Patient: Melinda Obrien  Procedure(s) Performed: Procedure(s) with comments: KNEE ARTHROSCOPY WITH LATERAL MENISECTOMY (Right) - Partial medial and lateral menisectomies and chondroplasty patella  Patient Location: PACU  Anesthesia Type:General  Level of Consciousness: awake and sedated  Airway & Oxygen Therapy: Patient Spontanous Breathing and Patient connected to face mask oxygen  Post-op Assessment: Report given to PACU RN and Post -op Vital signs reviewed and stable  Post vital signs: Reviewed and stable  Complications: No apparent anesthesia complications

## 2013-10-27 ENCOUNTER — Encounter (HOSPITAL_BASED_OUTPATIENT_CLINIC_OR_DEPARTMENT_OTHER): Payer: Self-pay | Admitting: Orthopedic Surgery

## 2015-03-21 ENCOUNTER — Encounter (HOSPITAL_COMMUNITY): Payer: Self-pay | Admitting: Psychiatry

## 2015-03-21 ENCOUNTER — Ambulatory Visit (INDEPENDENT_AMBULATORY_CARE_PROVIDER_SITE_OTHER): Payer: 59 | Admitting: Psychiatry

## 2015-03-21 VITALS — BP 138/81 | HR 96 | Ht 65.0 in | Wt 203.0 lb

## 2015-03-21 DIAGNOSIS — F3131 Bipolar disorder, current episode depressed, mild: Secondary | ICD-10-CM

## 2015-03-21 DIAGNOSIS — F1210 Cannabis abuse, uncomplicated: Secondary | ICD-10-CM | POA: Diagnosis not present

## 2015-03-21 DIAGNOSIS — Z79899 Other long term (current) drug therapy: Secondary | ICD-10-CM | POA: Diagnosis not present
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
## 2015-04-07 LAB — CBC WITH DIFFERENTIAL/PLATELET
BASOS PCT: 1 % (ref 0–1)
Basophils Absolute: 0.1 10*3/uL (ref 0.0–0.1)
Eosinophils Absolute: 0.1 10*3/uL (ref 0.0–0.7)
Eosinophils Relative: 1 % (ref 0–5)
HCT: 42.7 % (ref 36.0–46.0)
Hemoglobin: 14.7 g/dL (ref 12.0–15.0)
Lymphocytes Relative: 46 % (ref 12–46)
Lymphs Abs: 3.2 10*3/uL (ref 0.7–4.0)
MCH: 31.2 pg (ref 26.0–34.0)
MCHC: 34.4 g/dL (ref 30.0–36.0)
MCV: 90.7 fL (ref 78.0–100.0)
MONOS PCT: 4 % (ref 3–12)
MPV: 8.2 fL — ABNORMAL LOW (ref 8.6–12.4)
Monocytes Absolute: 0.3 10*3/uL (ref 0.1–1.0)
NEUTROS ABS: 3.3 10*3/uL (ref 1.7–7.7)
NEUTROS PCT: 48 % (ref 43–77)
Platelets: 278 10*3/uL (ref 150–400)
RBC: 4.71 MIL/uL (ref 3.87–5.11)
RDW: 13.5 % (ref 11.5–15.5)
WBC: 6.9 10*3/uL (ref 4.0–10.5)

## 2015-04-08 LAB — COMPLETE METABOLIC PANEL WITH GFR
ALK PHOS: 54 U/L (ref 39–117)
ALT: 15 U/L (ref 0–35)
AST: 15 U/L (ref 0–37)
Albumin: 4.7 g/dL (ref 3.5–5.2)
BILIRUBIN TOTAL: 0.5 mg/dL (ref 0.2–1.2)
BUN: 16 mg/dL (ref 6–23)
CO2: 26 mEq/L (ref 19–32)
CREATININE: 0.81 mg/dL (ref 0.50–1.10)
Calcium: 9.7 mg/dL (ref 8.4–10.5)
Chloride: 101 mEq/L (ref 96–112)
GFR, Est African American: >89 mL/min
GFR, Est Non African American: 83 mL/min
Glucose, Bld: 90 mg/dL (ref 70–99)
POTASSIUM: 4.5 meq/L (ref 3.5–5.3)
SODIUM: 135 meq/L (ref 135–145)
Total Protein: 7.1 g/dL (ref 6.0–8.3)

## 2015-04-08 LAB — HEMOGLOBIN A1C
Hgb A1c MFr Bld: 5.6 % (ref <5.7)
Mean Plasma Glucose: 114 mg/dL (ref <117)

## 2015-04-08 LAB — VALPROIC ACID LEVEL: Valproic Acid Lvl: <12.5 ug/mL — ABNORMAL LOW (ref 50.0–100.0)

## 2015-04-12 ENCOUNTER — Ambulatory Visit (INDEPENDENT_AMBULATORY_CARE_PROVIDER_SITE_OTHER): Payer: 59 | Admitting: Psychiatry

## 2015-04-12 ENCOUNTER — Encounter (HOSPITAL_COMMUNITY): Payer: Self-pay | Admitting: Psychiatry

## 2015-04-12 VITALS — BP 106/76 | HR 88 | Ht 65.0 in | Wt 202.0 lb

## 2015-04-12 DIAGNOSIS — F1210 Cannabis abuse, uncomplicated: Secondary | ICD-10-CM | POA: Diagnosis not present

## 2015-04-12 DIAGNOSIS — F3131 Bipolar disorder, current episode depressed, mild: Secondary | ICD-10-CM | POA: Diagnosis not present

## 2015-04-12 MED ORDER — CLONAZEPAM 0.5 MG PO TABS: 0.5000 mg | ORAL_TABLET | Freq: Three times a day (TID) | ORAL | Status: DC | PRN
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
## 2015-04-14 ENCOUNTER — Other Ambulatory Visit (HOSPITAL_COMMUNITY): Payer: Self-pay | Admitting: Psychiatry

## 2015-04-14 ENCOUNTER — Telehealth (HOSPITAL_COMMUNITY): Payer: Self-pay

## 2015-04-14 DIAGNOSIS — F3131 Bipolar disorder, current episode depressed, mild: Secondary | ICD-10-CM

## 2015-04-15 MED ORDER — ZOLPIDEM TARTRATE 10 MG PO TABS: 10.0000 mg | ORAL_TABLET | Freq: Every day | ORAL | Status: DC

## 2015-04-15 NOTE — Telephone Encounter (Signed)
Telephone message from patient requesting a refill of her Ambien.  Patient stated she saw Dr. Adele Schilder for the first time on 04/12/15 but was not aware she needed a refill at that time.  Patient stated her past psychiatrist would not refill again since she was no longer under their care and returns to see Dr. Adele Schilder on 05/11/15.  [facility]s [facility] [address] [facility]

## 2015-04-15 NOTE — Telephone Encounter (Signed)
Okay to refill Ambien? 

## 2015-04-15 NOTE — Telephone Encounter (Signed)
Called in authorized one time order for patient's requested Amb[facility] to [facility] [address] with Shirlean Mylar, pharmacist.  Telephone message left for patient this one time refill was authorized by Dr. Adele Schilder and [facility].  Reminded patient of need to keep next appointment on 05/11/15.

## 2015-05-11 ENCOUNTER — Encounter (HOSPITAL_COMMUNITY): Payer: Self-pay | Admitting: Psychiatry

## 2015-05-11 ENCOUNTER — Ambulatory Visit (INDEPENDENT_AMBULATORY_CARE_PROVIDER_SITE_OTHER): Payer: 59 | Admitting: Psychiatry

## 2015-05-11 VITALS — BP 132/76 | HR 94 | Ht 63.0 in | Wt 202.0 lb

## 2015-05-11 DIAGNOSIS — F1210 Cannabis abuse, uncomplicated: Secondary | ICD-10-CM

## 2015-05-11 DIAGNOSIS — F3131 Bipolar disorder, current episode depressed, mild: Secondary | ICD-10-CM | POA: Diagnosis not present

## 2015-05-11 MED ORDER — LAMOTRIGINE 200 MG PO TABS: 200.0000 mg | ORAL_TABLET | Freq: Every evening | ORAL | Status: DC

## 2015-05-11 MED ORDER — CLONAZEPAM 0.5 MG PO TABS: 0.5000 mg | ORAL_TABLET | Freq: Three times a day (TID) | ORAL | Status: DC | PRN

## 2015-05-11 MED ORDER — BUPROPION HCL ER (XL) 150 MG PO TB24: 150.0000 mg | ORAL_TABLET | ORAL | Status: DC

## 2015-05-11 MED ORDER — ZOLPIDEM TARTRATE 10 MG PO TABS: 10.0000 mg | ORAL_TABLET | Freq: Every day | ORAL | Status: DC
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
## 2015-05-30 ENCOUNTER — Telehealth (HOSPITAL_COMMUNITY): Payer: Self-pay | Admitting: Psychiatry

## 2015-05-30 ENCOUNTER — Telehealth (HOSPITAL_COMMUNITY): Payer: Self-pay

## 2015-05-30 DIAGNOSIS — F3131 Bipolar disorder, current episode depressed, mild: Secondary | ICD-10-CM

## 2015-05-30 MED ORDER — BUPROPION HCL ER (XL) 300 MG PO TB24: 300.0000 mg | ORAL_TABLET | ORAL | Status: DC

## 2015-05-30 NOTE — Telephone Encounter (Signed)
I returned phone call and left a message. 

## 2015-05-30 NOTE — Telephone Encounter (Signed)
I called patient.  She wants to try a higher dose of Wellbutrin.  She see some improvement with low-dose but wondering the dose can be further increase.  We'll try Wellbutrin 300.  Discussed medication side effects.  Recommended to call us back if she does not feel any improvement.

## 2015-05-30 NOTE — Telephone Encounter (Signed)
Telephone call with patient to follow up on her report she initially felt better with Wellbutrin and Lamictal but now just feels "blah" with no motivation.  States she is sleeping too much and requests consideration for a possible change or increase in medication due to she does not return to see Dr. Adele Schilder until 07/14/15.  Patient denies any suicidal or homicidal ideations or other concerns but states she is just tired most of the time and does not feel medication is helping as much as she would like.

## 2015-06-07 ENCOUNTER — Other Ambulatory Visit (HOSPITAL_COMMUNITY): Payer: Self-pay | Admitting: Psychiatry

## 2015-06-08 ENCOUNTER — Other Ambulatory Visit (HOSPITAL_COMMUNITY): Payer: Self-pay | Admitting: Psychiatry

## 2015-06-08 DIAGNOSIS — F3131 Bipolar disorder, current episode depressed, mild: Secondary | ICD-10-CM

## 2015-06-09 MED ORDER — CLONAZEPAM 0.5 MG PO TABS: 0.5000 mg | ORAL_TABLET | Freq: Three times a day (TID) | ORAL | Status: DC | PRN

## 2015-06-09 NOTE — Telephone Encounter (Signed)
Met with Dr. Adele Schilder who authorized a one time refill of patient's prescribed Ambien and Klonopin [facility] and gave both one time refill orders as directed with Anderson Malta, pharmacist.  Patient to keep rescheduled appointment on 07/14/15.

## 2015-06-15 ENCOUNTER — Ambulatory Visit (HOSPITAL_COMMUNITY): Payer: Self-pay | Admitting: Psychiatry

## 2015-06-23 ENCOUNTER — Other Ambulatory Visit (HOSPITAL_COMMUNITY): Payer: Self-pay | Admitting: Psychiatry

## 2015-06-23 DIAGNOSIS — F3131 Bipolar disorder, current episode depressed, mild: Secondary | ICD-10-CM

## 2015-06-23 NOTE — Telephone Encounter (Signed)
One time refill of patient's Wellbutrin authorized by Dr. Salem Senate as patient was rescheduled from 06/15/15 to 07/14/15 to return to see Dr. Adele Schilder. One time [facility] [address].

## 2015-07-05 ENCOUNTER — Other Ambulatory Visit (HOSPITAL_COMMUNITY): Payer: Self-pay | Admitting: Psychiatry

## 2015-07-05 DIAGNOSIS — F3131 Bipolar disorder, current episode depressed, mild: Secondary | ICD-10-CM

## 2015-07-07 NOTE — Telephone Encounter (Signed)
Met with Dr. Adele Schilder who approved a one time refill of patient's prescribed Ambien and Klonopin with no early refills.  Called in both 30 day orders to [facility] on [address] with Shirlean Mylar, Pharmacist as authorized by Dr. Adele Schilder.  Called patient to inform orders were called into her pharmacy and she questioned if she could pick up today as she is going out of town.  Agreed to question if okay with Dr. Adele Schilder and will [facility] back if approved.  Met with Dr Adele Schilder who authorized patient to have Ambien and Klonopin orders filled today, 2 days early since going out of town this evening and [facility] with Shirlean Mylar, pharmacist to inform Dr Adele Schilder authorized.  Called patient back and left a message to inform medication were authorized to be filled 2 days early today so will have when leaves to go out of town.

## 2015-07-14 ENCOUNTER — Encounter (HOSPITAL_COMMUNITY): Payer: Self-pay | Admitting: Psychiatry

## 2015-07-14 ENCOUNTER — Ambulatory Visit (INDEPENDENT_AMBULATORY_CARE_PROVIDER_SITE_OTHER): Payer: 59 | Admitting: Psychiatry

## 2015-07-14 VITALS — BP 134/76 | HR 100 | Ht 65.0 in | Wt 212.8 lb

## 2015-07-14 DIAGNOSIS — F1221 Cannabis dependence, in remission: Secondary | ICD-10-CM | POA: Diagnosis not present

## 2015-07-14 DIAGNOSIS — F3131 Bipolar disorder, current episode depressed, mild: Secondary | ICD-10-CM

## 2015-07-14 MED ORDER — ZOLPIDEM TARTRATE 10 MG PO TABS: 10.0000 mg | ORAL_TABLET | Freq: Every day | ORAL | Status: DC

## 2015-07-14 MED ORDER — CLONAZEPAM 0.5 MG PO TABS: 0.5000 mg | ORAL_TABLET | Freq: Two times a day (BID) | ORAL | Status: DC | PRN

## 2015-07-14 MED ORDER — LATUDA 40 MG PO TABS: 40.0000 mg | ORAL_TABLET | Freq: Every day | ORAL | Status: DC

## 2015-07-14 MED ORDER — BUPROPION HCL ER (XL) 150 MG PO TB24: 300.0000 mg | ORAL_TABLET | Freq: Every morning | ORAL | Status: DC
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
## 2015-08-03 ENCOUNTER — Telehealth (HOSPITAL_COMMUNITY): Payer: Self-pay

## 2015-08-03 ENCOUNTER — Other Ambulatory Visit (HOSPITAL_COMMUNITY): Payer: Self-pay | Admitting: Psychiatry

## 2015-08-03 DIAGNOSIS — F3131 Bipolar disorder, current episode depressed, mild: Secondary | ICD-10-CM

## 2015-08-03 MED ORDER — CLONAZEPAM 0.5 MG PO TABS: 0.5000 mg | ORAL_TABLET | Freq: Three times a day (TID) | ORAL | Status: DC | PRN

## 2015-08-03 NOTE — Telephone Encounter (Signed)
Medication management - Left 2 messages for patient and [facility] [address] to follow up on message left by pt. her recent Clonazepam order was incorrectly written for BID and not TID. Requested patient call back or return the prescription to correct as she has not taken the new prescription to her pharmacy as of this date.  Requested patient call back to discuss and to assist her with getting corrected order.

## 2015-10-01 ENCOUNTER — Other Ambulatory Visit (HOSPITAL_COMMUNITY): Payer: Self-pay | Admitting: Psychiatry

## 2015-10-07 ENCOUNTER — Other Ambulatory Visit (HOSPITAL_COMMUNITY): Payer: Self-pay

## 2015-10-07 DIAGNOSIS — F3131 Bipolar disorder, current episode depressed, mild: Secondary | ICD-10-CM

## 2015-10-07 MED ORDER — BUPROPION HCL ER (XL) 150 MG PO TB24: 300.0000 mg | ORAL_TABLET | Freq: Every morning | ORAL | Status: DC

## 2015-10-07 NOTE — Telephone Encounter (Signed)
Medication refill request - Fax received for a refill of pt's prescribed Wellbutrin only written for 15 day supply orders on 07/14/15 and pt. now out.  Dr. Adele Schilder authorized a one time 30 day order be e-scribed to [facility] as pt. returns 10/10/15.  A new 30 day order e-scribed to [facility] on [address] as authorized by Dr. Adele Schilder for #[id number].

## 2015-10-10 ENCOUNTER — Ambulatory Visit (INDEPENDENT_AMBULATORY_CARE_PROVIDER_SITE_OTHER): Payer: 59 | Admitting: Psychiatry

## 2015-10-10 ENCOUNTER — Encounter (HOSPITAL_COMMUNITY): Payer: Self-pay | Admitting: Psychiatry

## 2015-10-10 VITALS — BP 148/82 | HR 114 | Wt 214.0 lb

## 2015-10-10 DIAGNOSIS — F1221 Cannabis dependence, in remission: Secondary | ICD-10-CM | POA: Diagnosis not present

## 2015-10-10 DIAGNOSIS — F3131 Bipolar disorder, current episode depressed, mild: Secondary | ICD-10-CM

## 2015-10-10 MED ORDER — ZOLPIDEM TARTRATE 10 MG PO TABS: 10.0000 mg | ORAL_TABLET | Freq: Every day | ORAL | Status: DC

## 2015-10-10 MED ORDER — CLONAZEPAM 0.5 MG PO TABS: 0.5000 mg | ORAL_TABLET | Freq: Three times a day (TID) | ORAL | Status: DC | PRN
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
## 2016-01-10 ENCOUNTER — Ambulatory Visit (INDEPENDENT_AMBULATORY_CARE_PROVIDER_SITE_OTHER): Payer: 59 | Admitting: Psychiatry

## 2016-01-10 ENCOUNTER — Encounter (HOSPITAL_COMMUNITY): Payer: Self-pay | Admitting: Psychiatry

## 2016-01-10 VITALS — BP 128/84 | HR 114 | Ht 65.5 in | Wt 208.6 lb

## 2016-01-10 DIAGNOSIS — F3131 Bipolar disorder, current episode depressed, mild: Secondary | ICD-10-CM

## 2016-01-10 DIAGNOSIS — F1221 Cannabis dependence, in remission: Secondary | ICD-10-CM

## 2016-01-10 MED ORDER — CLONAZEPAM 0.5 MG PO TABS: 0.5000 mg | ORAL_TABLET | Freq: Three times a day (TID) | ORAL | Status: DC | PRN

## 2016-01-10 MED ORDER — LAMOTRIGINE 25 MG PO TABS: 50.0000 mg | ORAL_TABLET | Freq: Every day | ORAL | Status: DC

## 2016-01-10 MED ORDER — ZOLPIDEM TARTRATE 10 MG PO TABS: 10.0000 mg | ORAL_TABLET | Freq: Every day | ORAL | Status: DC
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
## 2016-04-10 ENCOUNTER — Ambulatory Visit (INDEPENDENT_AMBULATORY_CARE_PROVIDER_SITE_OTHER): Payer: 59 | Admitting: Psychiatry

## 2016-04-10 ENCOUNTER — Encounter (HOSPITAL_COMMUNITY): Payer: Self-pay | Admitting: Psychiatry

## 2016-04-10 VITALS — BP 106/68 | HR 100 | Ht 65.0 in | Wt 211.8 lb

## 2016-04-10 DIAGNOSIS — F3131 Bipolar disorder, current episode depressed, mild: Secondary | ICD-10-CM

## 2016-04-10 DIAGNOSIS — F1221 Cannabis dependence, in remission: Secondary | ICD-10-CM | POA: Diagnosis not present

## 2016-04-10 MED ORDER — DULOXETINE HCL 30 MG PO CPEP: ORAL_CAPSULE | ORAL | Status: DC

## 2016-04-10 MED ORDER — ZOLPIDEM TARTRATE 10 MG PO TABS: 10.0000 mg | ORAL_TABLET | Freq: Every day | ORAL | Status: DC

## 2016-04-10 MED ORDER — CLONAZEPAM 0.5 MG PO TABS: 0.5000 mg | ORAL_TABLET | Freq: Three times a day (TID) | ORAL | Status: DC | PRN
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
## 2016-04-15 ENCOUNTER — Other Ambulatory Visit (HOSPITAL_COMMUNITY): Payer: Self-pay | Admitting: Psychiatry

## 2016-05-10 ENCOUNTER — Other Ambulatory Visit (HOSPITAL_COMMUNITY): Payer: Self-pay | Admitting: Psychiatry

## 2016-05-16 ENCOUNTER — Telehealth (HOSPITAL_COMMUNITY): Payer: Self-pay

## 2016-05-16 DIAGNOSIS — F3131 Bipolar disorder, current episode depressed, mild: Secondary | ICD-10-CM

## 2016-05-16 MED ORDER — DULOXETINE HCL 30 MG PO CPEP: ORAL_CAPSULE | ORAL | Status: DC

## 2016-05-16 NOTE — Telephone Encounter (Signed)
Ok to refill for 30 days  

## 2016-05-16 NOTE — Telephone Encounter (Signed)
Patient is coming in tomorrow, but is out of her Cymbalta, can we fill for one month? Please review and advise, thank you

## 2016-05-16 NOTE — Telephone Encounter (Signed)
Sent an order for one month to pharmacy

## 2016-05-17 ENCOUNTER — Encounter (HOSPITAL_COMMUNITY): Payer: Self-pay | Admitting: Psychiatry

## 2016-05-17 ENCOUNTER — Ambulatory Visit (INDEPENDENT_AMBULATORY_CARE_PROVIDER_SITE_OTHER): Payer: 59 | Admitting: Psychiatry

## 2016-05-17 VITALS — BP 140/90 | HR 95 | Ht 65.0 in | Wt 211.2 lb

## 2016-05-17 DIAGNOSIS — F1221 Cannabis dependence, in remission: Secondary | ICD-10-CM | POA: Diagnosis not present

## 2016-05-17 DIAGNOSIS — F3131 Bipolar disorder, current episode depressed, mild: Secondary | ICD-10-CM

## 2016-05-17 MED ORDER — CLONAZEPAM 0.5 MG PO TABS: 0.5000 mg | ORAL_TABLET | Freq: Three times a day (TID) | ORAL | Status: DC | PRN

## 2016-05-17 MED ORDER — DULOXETINE HCL 60 MG PO CPEP: 60.0000 mg | ORAL_CAPSULE | Freq: Every day | ORAL | Status: DC

## 2016-05-17 MED ORDER — ZOLPIDEM TARTRATE 10 MG PO TABS: 10.0000 mg | ORAL_TABLET | Freq: Every day | ORAL | Status: DC
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
## 2016-05-18 ENCOUNTER — Other Ambulatory Visit (HOSPITAL_COMMUNITY): Payer: Self-pay | Admitting: Psychiatry

## 2016-07-16 ENCOUNTER — Other Ambulatory Visit (HOSPITAL_COMMUNITY): Payer: Self-pay

## 2016-07-16 ENCOUNTER — Other Ambulatory Visit (HOSPITAL_COMMUNITY): Payer: Self-pay | Admitting: Psychiatry

## 2016-07-16 DIAGNOSIS — F3131 Bipolar disorder, current episode depressed, mild: Secondary | ICD-10-CM

## 2016-07-16 MED ORDER — DULOXETINE HCL 60 MG PO CPEP: 60.0000 mg | ORAL_CAPSULE | Freq: Every day | ORAL | 0 refills | Status: DC

## 2016-07-18 ENCOUNTER — Other Ambulatory Visit (HOSPITAL_COMMUNITY): Payer: Self-pay

## 2016-07-18 DIAGNOSIS — F3131 Bipolar disorder, current episode depressed, mild: Secondary | ICD-10-CM

## 2016-07-18 MED ORDER — DULOXETINE HCL 60 MG PO CPEP: 60.0000 mg | ORAL_CAPSULE | Freq: Every day | ORAL | 0 refills | Status: DC

## 2016-07-20 ENCOUNTER — Other Ambulatory Visit (HOSPITAL_COMMUNITY): Payer: Self-pay | Admitting: Psychiatry

## 2016-07-20 DIAGNOSIS — F3131 Bipolar disorder, current episode depressed, mild: Secondary | ICD-10-CM

## 2016-07-26 ENCOUNTER — Other Ambulatory Visit (HOSPITAL_COMMUNITY): Payer: Self-pay

## 2016-07-26 DIAGNOSIS — F3131 Bipolar disorder, current episode depressed, mild: Secondary | ICD-10-CM

## 2016-07-26 MED ORDER — ZOLPIDEM TARTRATE 10 MG PO TABS: 10.0000 mg | ORAL_TABLET | Freq: Every day | ORAL | 0 refills | Status: DC

## 2016-08-16 ENCOUNTER — Ambulatory Visit (HOSPITAL_COMMUNITY): Payer: Self-pay | Admitting: Psychiatry

## 2016-09-10 ENCOUNTER — Other Ambulatory Visit (HOSPITAL_COMMUNITY): Payer: Self-pay

## 2016-09-10 DIAGNOSIS — F3131 Bipolar disorder, current episode depressed, mild: Secondary | ICD-10-CM

## 2016-09-10 MED ORDER — CLONAZEPAM 0.5 MG PO TABS: 0.5000 mg | ORAL_TABLET | Freq: Three times a day (TID) | ORAL | 2 refills | Status: DC | PRN

## 2016-09-10 MED ORDER — DULOXETINE HCL 60 MG PO CPEP: 60.0000 mg | ORAL_CAPSULE | Freq: Every day | ORAL | 0 refills | Status: DC

## 2016-09-10 MED ORDER — ZOLPIDEM TARTRATE 10 MG PO TABS: 10.0000 mg | ORAL_TABLET | Freq: Every day | ORAL | 0 refills | Status: DC

## 2016-09-12 ENCOUNTER — Ambulatory Visit (HOSPITAL_COMMUNITY): Payer: Self-pay | Admitting: Psychiatry

## 2016-10-10 ENCOUNTER — Encounter (HOSPITAL_COMMUNITY): Payer: Self-pay | Admitting: Psychiatry

## 2016-10-10 ENCOUNTER — Ambulatory Visit (INDEPENDENT_AMBULATORY_CARE_PROVIDER_SITE_OTHER): Payer: 59 | Admitting: Psychiatry

## 2016-10-10 DIAGNOSIS — F3131 Bipolar disorder, current episode depressed, mild: Secondary | ICD-10-CM

## 2016-10-10 DIAGNOSIS — F1221 Cannabis dependence, in remission: Secondary | ICD-10-CM | POA: Diagnosis not present

## 2016-10-10 DIAGNOSIS — Z79899 Other long term (current) drug therapy: Secondary | ICD-10-CM

## 2016-10-10 DIAGNOSIS — Z818 Family history of other mental and behavioral disorders: Secondary | ICD-10-CM

## 2016-10-10 MED ORDER — CLONAZEPAM 0.5 MG PO TABS: 0.5000 mg | ORAL_TABLET | Freq: Three times a day (TID) | ORAL | 2 refills | Status: DC | PRN

## 2016-10-10 MED ORDER — DULOXETINE HCL 60 MG PO CPEP: 60.0000 mg | ORAL_CAPSULE | Freq: Every day | ORAL | 0 refills | Status: DC

## 2016-10-10 MED ORDER — ZOLPIDEM TARTRATE 10 MG PO TABS: 10.0000 mg | ORAL_TABLET | Freq: Every day | ORAL | 2 refills | Status: DC
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
## 2017-01-09 ENCOUNTER — Other Ambulatory Visit (HOSPITAL_COMMUNITY): Payer: Self-pay | Admitting: Psychiatry

## 2017-01-09 DIAGNOSIS — F3131 Bipolar disorder, current episode depressed, mild: Secondary | ICD-10-CM

## 2017-01-10 ENCOUNTER — Encounter (HOSPITAL_COMMUNITY): Payer: Self-pay | Admitting: Psychiatry

## 2017-01-10 ENCOUNTER — Ambulatory Visit (INDEPENDENT_AMBULATORY_CARE_PROVIDER_SITE_OTHER): Payer: 59 | Admitting: Psychiatry

## 2017-01-10 VITALS — BP 128/70 | HR 103 | Ht 65.0 in | Wt 224.2 lb

## 2017-01-10 DIAGNOSIS — Z880 Allergy status to penicillin: Secondary | ICD-10-CM

## 2017-01-10 DIAGNOSIS — Z882 Allergy status to sulfonamides status: Secondary | ICD-10-CM

## 2017-01-10 DIAGNOSIS — Z8249 Family history of ischemic heart disease and other diseases of the circulatory system: Secondary | ICD-10-CM | POA: Diagnosis not present

## 2017-01-10 DIAGNOSIS — F17210 Nicotine dependence, cigarettes, uncomplicated: Secondary | ICD-10-CM

## 2017-01-10 DIAGNOSIS — Z9851 Tubal ligation status: Secondary | ICD-10-CM

## 2017-01-10 DIAGNOSIS — Z79899 Other long term (current) drug therapy: Secondary | ICD-10-CM

## 2017-01-10 DIAGNOSIS — Z9889 Other specified postprocedural states: Secondary | ICD-10-CM | POA: Diagnosis not present

## 2017-01-10 DIAGNOSIS — Z808 Family history of malignant neoplasm of other organs or systems: Secondary | ICD-10-CM

## 2017-01-10 DIAGNOSIS — Z811 Family history of alcohol abuse and dependence: Secondary | ICD-10-CM

## 2017-01-10 DIAGNOSIS — F3131 Bipolar disorder, current episode depressed, mild: Secondary | ICD-10-CM

## 2017-01-10 DIAGNOSIS — Z888 Allergy status to other drugs, medicaments and biological substances status: Secondary | ICD-10-CM

## 2017-01-10 MED ORDER — DULOXETINE HCL 60 MG PO CPEP: 60.0000 mg | ORAL_CAPSULE | Freq: Every day | ORAL | 0 refills | Status: DC

## 2017-01-10 MED ORDER — CLONAZEPAM 0.5 MG PO TABS: 0.5000 mg | ORAL_TABLET | Freq: Three times a day (TID) | ORAL | 2 refills | Status: DC | PRN

## 2017-01-10 MED ORDER — ZOLPIDEM TARTRATE 10 MG PO TABS: 10.0000 mg | ORAL_TABLET | Freq: Every day | ORAL | 2 refills | Status: DC
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
## 2017-02-12 DIAGNOSIS — M545 Low back pain: Secondary | ICD-10-CM | POA: Diagnosis not present

## 2017-02-16 DIAGNOSIS — M545 Low back pain: Secondary | ICD-10-CM | POA: Diagnosis not present

## 2017-02-26 DIAGNOSIS — M545 Low back pain: Secondary | ICD-10-CM | POA: Diagnosis not present

## 2017-03-04 DIAGNOSIS — M47816 Spondylosis without myelopathy or radiculopathy, lumbar region: Secondary | ICD-10-CM | POA: Diagnosis not present

## 2017-03-12 DIAGNOSIS — M47816 Spondylosis without myelopathy or radiculopathy, lumbar region: Secondary | ICD-10-CM | POA: Diagnosis not present

## 2017-04-04 ENCOUNTER — Telehealth (HOSPITAL_COMMUNITY): Payer: Self-pay

## 2017-04-04 ENCOUNTER — Other Ambulatory Visit (HOSPITAL_COMMUNITY): Payer: Self-pay | Admitting: Psychiatry

## 2017-04-04 DIAGNOSIS — F3131 Bipolar disorder, current episode depressed, mild: Secondary | ICD-10-CM

## 2017-04-04 MED ORDER — CLONAZEPAM 0.5 MG PO TABS: 0.5000 mg | ORAL_TABLET | Freq: Three times a day (TID) | ORAL | 0 refills | Status: DC | PRN

## 2017-04-04 NOTE — Telephone Encounter (Signed)
Patient has an appointment on 04/09/2017 - she is calling for a refill on her Klonopin, she states she has 2 pills left. Patient was given a 90 day supply on 01/10/2017 and did not need refills on her other medications given at the same time. Please review and advise, thank you

## 2017-04-04 NOTE — Telephone Encounter (Signed)
Please provide 7 day supply until she had appointment.  We will discuss this issue on her next appointment.

## 2017-04-04 NOTE — Telephone Encounter (Signed)
Called in 21 tablets (7 day supply) to the [facility] on [address]. I called patient to let her know and reminded her not to miss her appointment next week.

## 2017-04-09 ENCOUNTER — Encounter (HOSPITAL_COMMUNITY): Payer: Self-pay | Admitting: Psychiatry

## 2017-04-09 ENCOUNTER — Ambulatory Visit (INDEPENDENT_AMBULATORY_CARE_PROVIDER_SITE_OTHER): Payer: 59 | Admitting: Psychiatry

## 2017-04-09 DIAGNOSIS — Z79899 Other long term (current) drug therapy: Secondary | ICD-10-CM

## 2017-04-09 DIAGNOSIS — M549 Dorsalgia, unspecified: Secondary | ICD-10-CM

## 2017-04-09 DIAGNOSIS — G4700 Insomnia, unspecified: Secondary | ICD-10-CM | POA: Diagnosis not present

## 2017-04-09 DIAGNOSIS — F3131 Bipolar disorder, current episode depressed, mild: Secondary | ICD-10-CM | POA: Diagnosis not present

## 2017-04-09 DIAGNOSIS — Z818 Family history of other mental and behavioral disorders: Secondary | ICD-10-CM

## 2017-04-09 DIAGNOSIS — Z811 Family history of alcohol abuse and dependence: Secondary | ICD-10-CM

## 2017-04-09 MED ORDER — ZOLPIDEM TARTRATE 10 MG PO TABS: 10.0000 mg | ORAL_TABLET | Freq: Every day | ORAL | 2 refills | Status: DC

## 2017-04-09 MED ORDER — CLONAZEPAM 0.5 MG PO TABS: 0.5000 mg | ORAL_TABLET | Freq: Three times a day (TID) | ORAL | 2 refills | Status: DC | PRN

## 2017-04-09 MED ORDER — DULOXETINE HCL 60 MG PO CPEP: 60.0000 mg | ORAL_CAPSULE | Freq: Every day | ORAL | 0 refills | Status: DC
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
## 2017-05-09 DIAGNOSIS — Z720 Tobacco use: Secondary | ICD-10-CM | POA: Diagnosis not present

## 2017-05-09 DIAGNOSIS — L299 Pruritus, unspecified: Secondary | ICD-10-CM | POA: Diagnosis not present

## 2017-05-09 DIAGNOSIS — M545 Low back pain: Secondary | ICD-10-CM | POA: Diagnosis not present

## 2017-06-05 DIAGNOSIS — Z0000 Encounter for general adult medical examination without abnormal findings: Secondary | ICD-10-CM | POA: Diagnosis not present

## 2017-06-05 DIAGNOSIS — Z13220 Encounter for screening for lipoid disorders: Secondary | ICD-10-CM | POA: Diagnosis not present

## 2017-07-02 DIAGNOSIS — R202 Paresthesia of skin: Secondary | ICD-10-CM | POA: Diagnosis not present

## 2017-07-03 ENCOUNTER — Other Ambulatory Visit (HOSPITAL_COMMUNITY): Payer: Self-pay | Admitting: Psychiatry

## 2017-07-03 DIAGNOSIS — F3131 Bipolar disorder, current episode depressed, mild: Secondary | ICD-10-CM

## 2017-07-08 ENCOUNTER — Other Ambulatory Visit (HOSPITAL_COMMUNITY): Payer: Self-pay

## 2017-07-08 DIAGNOSIS — F3131 Bipolar disorder, current episode depressed, mild: Secondary | ICD-10-CM

## 2017-07-08 MED ORDER — ZOLPIDEM TARTRATE 10 MG PO TABS: 10.0000 mg | ORAL_TABLET | Freq: Every day | ORAL | 0 refills | Status: DC

## 2017-07-08 MED ORDER — CLONAZEPAM 0.5 MG PO TABS: 0.5000 mg | ORAL_TABLET | Freq: Three times a day (TID) | ORAL | 0 refills | Status: DC | PRN

## 2017-07-08 NOTE — Progress Notes (Unsigned)
Patient is calling because she has an appointment this Wednesday but only has enough medication for today. I called and s/w the pharmacist and it would be less expensive for the patient to send 30 days as opposed to 2 days worth of medication. Per protocol, patient was here in the last 3 months, she has a  Follow up this week, I sent a 30 day supply of the Clonidine and Ambien.

## 2017-07-10 ENCOUNTER — Ambulatory Visit (HOSPITAL_COMMUNITY): Payer: 59 | Admitting: Psychiatry

## 2017-07-11 DIAGNOSIS — R1310 Dysphagia, unspecified: Secondary | ICD-10-CM | POA: Diagnosis not present

## 2017-07-11 DIAGNOSIS — Z1211 Encounter for screening for malignant neoplasm of colon: Secondary | ICD-10-CM | POA: Diagnosis not present

## 2017-07-11 DIAGNOSIS — R1013 Epigastric pain: Secondary | ICD-10-CM | POA: Diagnosis not present

## 2017-07-22 ENCOUNTER — Ambulatory Visit (INDEPENDENT_AMBULATORY_CARE_PROVIDER_SITE_OTHER): Payer: 59 | Admitting: Psychiatry

## 2017-07-22 ENCOUNTER — Encounter (HOSPITAL_COMMUNITY): Payer: Self-pay | Admitting: Psychiatry

## 2017-07-22 VITALS — BP 127/84 | HR 100 | Ht 65.25 in | Wt 233.0 lb

## 2017-07-22 DIAGNOSIS — Z811 Family history of alcohol abuse and dependence: Secondary | ICD-10-CM

## 2017-07-22 DIAGNOSIS — M2550 Pain in unspecified joint: Secondary | ICD-10-CM

## 2017-07-22 DIAGNOSIS — Z818 Family history of other mental and behavioral disorders: Secondary | ICD-10-CM | POA: Diagnosis not present

## 2017-07-22 DIAGNOSIS — F3131 Bipolar disorder, current episode depressed, mild: Secondary | ICD-10-CM | POA: Diagnosis not present

## 2017-07-22 DIAGNOSIS — M549 Dorsalgia, unspecified: Secondary | ICD-10-CM

## 2017-07-22 DIAGNOSIS — F419 Anxiety disorder, unspecified: Secondary | ICD-10-CM | POA: Diagnosis not present

## 2017-07-22 DIAGNOSIS — F17210 Nicotine dependence, cigarettes, uncomplicated: Secondary | ICD-10-CM | POA: Diagnosis not present

## 2017-07-22 DIAGNOSIS — Z62810 Personal history of physical and sexual abuse in childhood: Secondary | ICD-10-CM | POA: Diagnosis not present

## 2017-07-22 MED ORDER — GABAPENTIN 600 MG PO TABS: 600.0000 mg | ORAL_TABLET | Freq: Two times a day (BID) | ORAL | 0 refills | Status: DC

## 2017-07-22 MED ORDER — ZOLPIDEM TARTRATE 10 MG PO TABS: 10.0000 mg | ORAL_TABLET | Freq: Every evening | ORAL | 2 refills | Status: DC | PRN

## 2017-07-22 MED ORDER — DULOXETINE HCL 60 MG PO CPEP: 60.0000 mg | ORAL_CAPSULE | Freq: Every day | ORAL | 0 refills | Status: DC

## 2017-07-22 MED ORDER — CLONAZEPAM 0.5 MG PO TABS: 0.5000 mg | ORAL_TABLET | Freq: Two times a day (BID) | ORAL | 2 refills | Status: DC | PRN
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
## 2017-07-31 DIAGNOSIS — K259 Gastric ulcer, unspecified as acute or chronic, without hemorrhage or perforation: Secondary | ICD-10-CM | POA: Diagnosis not present

## 2017-07-31 DIAGNOSIS — D126 Benign neoplasm of colon, unspecified: Secondary | ICD-10-CM | POA: Diagnosis not present

## 2017-07-31 DIAGNOSIS — K3189 Other diseases of stomach and duodenum: Secondary | ICD-10-CM | POA: Diagnosis not present

## 2017-07-31 DIAGNOSIS — Z1211 Encounter for screening for malignant neoplasm of colon: Secondary | ICD-10-CM | POA: Diagnosis not present

## 2017-08-26 ENCOUNTER — Telehealth (HOSPITAL_COMMUNITY): Payer: Self-pay

## 2017-08-26 DIAGNOSIS — F3131 Bipolar disorder, current episode depressed, mild: Secondary | ICD-10-CM

## 2017-08-26 NOTE — Telephone Encounter (Signed)
Patient is calling because the Gabapentin is not working as well as it was at her last visit. She would like to go back to go her original doses of Klonopin and Ambien. She has been having really bad anxiety and pain and is really needing something. Please review and advise, thank you

## 2017-08-27 ENCOUNTER — Other Ambulatory Visit (HOSPITAL_COMMUNITY): Payer: Self-pay | Admitting: Psychiatry

## 2017-08-27 MED ORDER — CLONAZEPAM 0.5 MG PO TABS: 0.5000 mg | ORAL_TABLET | Freq: Three times a day (TID) | ORAL | 1 refills | Status: DC | PRN

## 2017-08-27 NOTE — Telephone Encounter (Signed)
She was given Klonopin and Ambien on her last visit.  And it should be taking every other night because it may stop working if she will take every day

## 2017-08-27 NOTE — Telephone Encounter (Signed)
I called the patient and she said that she is okay with taking Ambien every other night, but she has been taking Klonopin 3 a day instead of 2 a day and she is out now. Patient states she has chewed up the inside of her mouth and she can not get a refill until next week. Please review and advise, thank you

## 2017-08-27 NOTE — Telephone Encounter (Signed)
Change Klonopin to 0.5 mg 3 times a day and call pharmacy for a new prescription.

## 2017-08-27 NOTE — Telephone Encounter (Signed)
Called in the new order, discontinued the previous and called patient to let her know

## 2017-09-10 DIAGNOSIS — M545 Low back pain: Secondary | ICD-10-CM | POA: Diagnosis not present

## 2017-09-11 DIAGNOSIS — M5416 Radiculopathy, lumbar region: Secondary | ICD-10-CM | POA: Diagnosis not present

## 2017-09-11 DIAGNOSIS — M545 Low back pain: Secondary | ICD-10-CM | POA: Diagnosis not present

## 2017-09-17 DIAGNOSIS — M545 Low back pain: Secondary | ICD-10-CM | POA: Diagnosis not present

## 2017-09-17 DIAGNOSIS — M533 Sacrococcygeal disorders, not elsewhere classified: Secondary | ICD-10-CM | POA: Diagnosis not present

## 2017-09-17 DIAGNOSIS — M5416 Radiculopathy, lumbar region: Secondary | ICD-10-CM | POA: Diagnosis not present

## 2017-10-01 DIAGNOSIS — K2930 Chronic superficial gastritis without bleeding: Secondary | ICD-10-CM | POA: Diagnosis not present

## 2017-10-01 DIAGNOSIS — K257 Chronic gastric ulcer without hemorrhage or perforation: Secondary | ICD-10-CM | POA: Diagnosis not present

## 2017-10-01 DIAGNOSIS — K259 Gastric ulcer, unspecified as acute or chronic, without hemorrhage or perforation: Secondary | ICD-10-CM | POA: Diagnosis not present

## 2017-10-01 DIAGNOSIS — K224 Dyskinesia of esophagus: Secondary | ICD-10-CM | POA: Diagnosis not present

## 2017-10-09 DIAGNOSIS — K580 Irritable bowel syndrome with diarrhea: Secondary | ICD-10-CM | POA: Diagnosis not present

## 2017-10-09 DIAGNOSIS — K649 Unspecified hemorrhoids: Secondary | ICD-10-CM | POA: Diagnosis not present

## 2017-10-09 DIAGNOSIS — R1032 Left lower quadrant pain: Secondary | ICD-10-CM | POA: Diagnosis not present

## 2017-10-11 ENCOUNTER — Other Ambulatory Visit: Payer: Self-pay | Admitting: Gastroenterology

## 2017-10-11 DIAGNOSIS — R1032 Left lower quadrant pain: Secondary | ICD-10-CM

## 2017-10-17 ENCOUNTER — Ambulatory Visit
Admission: RE | Admit: 2017-10-17 | Discharge: 2017-10-17 | Disposition: A | Discharge status: Home / Self Care | Payer: 59 | Source: Ambulatory Visit | Attending: Gastroenterology | Admitting: Gastroenterology

## 2017-10-17 DIAGNOSIS — R1012 Left upper quadrant pain: Secondary | ICD-10-CM | POA: Diagnosis not present

## 2017-10-17 DIAGNOSIS — R1032 Left lower quadrant pain: Secondary | ICD-10-CM

## 2017-10-17 MED ORDER — IOPAMIDOL (ISOVUE-300) INJECTION 61%
125.0000 mL | Freq: Once | INTRAVENOUS | Status: AC | PRN
  Administered 2017-10-17: 125 mL via INTRAVENOUS

## 2017-10-18 DIAGNOSIS — D225 Melanocytic nevi of trunk: Secondary | ICD-10-CM | POA: Diagnosis not present

## 2017-10-18 DIAGNOSIS — L820 Inflamed seborrheic keratosis: Secondary | ICD-10-CM | POA: Diagnosis not present

## 2017-10-18 DIAGNOSIS — M533 Sacrococcygeal disorders, not elsewhere classified: Secondary | ICD-10-CM | POA: Diagnosis not present

## 2017-10-18 DIAGNOSIS — L814 Other melanin hyperpigmentation: Secondary | ICD-10-CM | POA: Diagnosis not present

## 2017-10-18 DIAGNOSIS — L821 Other seborrheic keratosis: Secondary | ICD-10-CM | POA: Diagnosis not present

## 2017-10-18 DIAGNOSIS — M545 Low back pain: Secondary | ICD-10-CM | POA: Diagnosis not present

## 2017-10-18 DIAGNOSIS — D0472 Carcinoma in situ of skin of left lower limb, including hip: Secondary | ICD-10-CM | POA: Diagnosis not present

## 2017-10-22 ENCOUNTER — Ambulatory Visit (HOSPITAL_COMMUNITY): Payer: 59 | Admitting: Psychiatry

## 2017-10-22 ENCOUNTER — Encounter (HOSPITAL_COMMUNITY): Payer: Self-pay | Admitting: Psychiatry

## 2017-10-22 DIAGNOSIS — M549 Dorsalgia, unspecified: Secondary | ICD-10-CM

## 2017-10-22 DIAGNOSIS — Z6379 Other stressful life events affecting family and household: Secondary | ICD-10-CM

## 2017-10-22 DIAGNOSIS — R197 Diarrhea, unspecified: Secondary | ICD-10-CM | POA: Diagnosis not present

## 2017-10-22 DIAGNOSIS — F3131 Bipolar disorder, current episode depressed, mild: Secondary | ICD-10-CM

## 2017-10-22 DIAGNOSIS — Z818 Family history of other mental and behavioral disorders: Secondary | ICD-10-CM | POA: Diagnosis not present

## 2017-10-22 DIAGNOSIS — R109 Unspecified abdominal pain: Secondary | ICD-10-CM | POA: Diagnosis not present

## 2017-10-22 DIAGNOSIS — F17210 Nicotine dependence, cigarettes, uncomplicated: Secondary | ICD-10-CM | POA: Diagnosis not present

## 2017-10-22 DIAGNOSIS — R450 Nervousness: Secondary | ICD-10-CM

## 2017-10-22 DIAGNOSIS — M2550 Pain in unspecified joint: Secondary | ICD-10-CM | POA: Diagnosis not present

## 2017-10-22 DIAGNOSIS — F419 Anxiety disorder, unspecified: Secondary | ICD-10-CM | POA: Diagnosis not present

## 2017-10-22 DIAGNOSIS — Z811 Family history of alcohol abuse and dependence: Secondary | ICD-10-CM | POA: Diagnosis not present

## 2017-10-22 MED ORDER — CLONAZEPAM 0.5 MG PO TABS: 0.5000 mg | ORAL_TABLET | Freq: Three times a day (TID) | ORAL | 1 refills | Status: DC | PRN

## 2017-10-22 MED ORDER — LAMOTRIGINE 25 MG PO TABS: ORAL_TABLET | ORAL | 1 refills | Status: DC

## 2017-10-22 MED ORDER — ZOLPIDEM TARTRATE 10 MG PO TABS: 10.0000 mg | ORAL_TABLET | Freq: Every evening | ORAL | 1 refills | Status: DC | PRN

## 2017-10-22 MED ORDER — DULOXETINE HCL 60 MG PO CPEP: 60.0000 mg | ORAL_CAPSULE | Freq: Every day | ORAL | 0 refills | Status: DC
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
## 2017-10-23 ENCOUNTER — Other Ambulatory Visit: Payer: Self-pay | Admitting: Orthopedic Surgery

## 2017-10-23 DIAGNOSIS — M5440 Lumbago with sciatica, unspecified side: Secondary | ICD-10-CM

## 2017-11-04 ENCOUNTER — Other Ambulatory Visit: Payer: Self-pay | Admitting: Orthopedic Surgery

## 2017-11-04 DIAGNOSIS — M5440 Lumbago with sciatica, unspecified side: Secondary | ICD-10-CM

## 2017-11-05 ENCOUNTER — Inpatient Hospital Stay: Admission: RE | Admit: 2017-11-05 | Payer: Self-pay | Source: Ambulatory Visit

## 2017-11-05 ENCOUNTER — Inpatient Hospital Stay
Admission: RE | Admit: 2017-11-05 | Discharge: 2017-11-05 | Disposition: A | Discharge status: Home / Self Care | Payer: Self-pay | Source: Ambulatory Visit | Attending: Orthopedic Surgery | Admitting: Orthopedic Surgery

## 2017-11-05 ENCOUNTER — Other Ambulatory Visit: Payer: Self-pay

## 2017-11-06 DIAGNOSIS — M961 Postlaminectomy syndrome, not elsewhere classified: Secondary | ICD-10-CM | POA: Diagnosis not present

## 2017-11-06 DIAGNOSIS — K580 Irritable bowel syndrome with diarrhea: Secondary | ICD-10-CM | POA: Diagnosis not present

## 2017-11-06 DIAGNOSIS — G894 Chronic pain syndrome: Secondary | ICD-10-CM | POA: Diagnosis not present

## 2017-11-06 DIAGNOSIS — Z79891 Long term (current) use of opiate analgesic: Secondary | ICD-10-CM | POA: Diagnosis not present

## 2017-11-06 DIAGNOSIS — M47817 Spondylosis without myelopathy or radiculopathy, lumbosacral region: Secondary | ICD-10-CM | POA: Diagnosis not present

## 2017-11-06 DIAGNOSIS — Z79899 Other long term (current) drug therapy: Secondary | ICD-10-CM | POA: Diagnosis not present

## 2017-11-12 DIAGNOSIS — M545 Low back pain: Secondary | ICD-10-CM | POA: Diagnosis not present

## 2017-11-12 DIAGNOSIS — M79609 Pain in unspecified limb: Secondary | ICD-10-CM | POA: Diagnosis not present

## 2017-11-20 DIAGNOSIS — M47817 Spondylosis without myelopathy or radiculopathy, lumbosacral region: Secondary | ICD-10-CM | POA: Diagnosis not present

## 2017-11-29 ENCOUNTER — Other Ambulatory Visit: Payer: Self-pay

## 2017-12-04 DIAGNOSIS — M961 Postlaminectomy syndrome, not elsewhere classified: Secondary | ICD-10-CM | POA: Diagnosis not present

## 2017-12-04 DIAGNOSIS — Z79891 Long term (current) use of opiate analgesic: Secondary | ICD-10-CM | POA: Diagnosis not present

## 2017-12-04 DIAGNOSIS — G894 Chronic pain syndrome: Secondary | ICD-10-CM | POA: Diagnosis not present

## 2017-12-04 DIAGNOSIS — M47817 Spondylosis without myelopathy or radiculopathy, lumbosacral region: Secondary | ICD-10-CM | POA: Diagnosis not present

## 2017-12-04 DIAGNOSIS — Z79899 Other long term (current) drug therapy: Secondary | ICD-10-CM | POA: Diagnosis not present

## 2017-12-06 DIAGNOSIS — R197 Diarrhea, unspecified: Secondary | ICD-10-CM | POA: Diagnosis not present

## 2017-12-20 ENCOUNTER — Other Ambulatory Visit (HOSPITAL_COMMUNITY): Payer: Self-pay | Admitting: Psychiatry

## 2017-12-20 DIAGNOSIS — F3131 Bipolar disorder, current episode depressed, mild: Secondary | ICD-10-CM

## 2017-12-23 ENCOUNTER — Other Ambulatory Visit (HOSPITAL_COMMUNITY): Payer: Self-pay | Admitting: Psychiatry

## 2017-12-23 ENCOUNTER — Telehealth (HOSPITAL_COMMUNITY): Payer: Self-pay

## 2017-12-23 NOTE — Telephone Encounter (Signed)
Medication refill request - Fax received from pt's [facility] for a refill of her prescribed Lamictal and requests a new 90 day order. Patient is scheduled for visit 12/26/17.

## 2017-12-23 NOTE — Telephone Encounter (Signed)
Will wait for her appointment

## 2017-12-26 ENCOUNTER — Ambulatory Visit (HOSPITAL_COMMUNITY): Payer: 59 | Admitting: Psychiatry

## 2017-12-26 ENCOUNTER — Encounter (HOSPITAL_COMMUNITY): Payer: Self-pay | Admitting: Psychiatry

## 2017-12-26 DIAGNOSIS — G894 Chronic pain syndrome: Secondary | ICD-10-CM | POA: Diagnosis not present

## 2017-12-26 DIAGNOSIS — F3131 Bipolar disorder, current episode depressed, mild: Secondary | ICD-10-CM | POA: Diagnosis not present

## 2017-12-26 DIAGNOSIS — M2550 Pain in unspecified joint: Secondary | ICD-10-CM

## 2017-12-26 DIAGNOSIS — F419 Anxiety disorder, unspecified: Secondary | ICD-10-CM

## 2017-12-26 DIAGNOSIS — F17210 Nicotine dependence, cigarettes, uncomplicated: Secondary | ICD-10-CM

## 2017-12-26 DIAGNOSIS — Z818 Family history of other mental and behavioral disorders: Secondary | ICD-10-CM

## 2017-12-26 DIAGNOSIS — R197 Diarrhea, unspecified: Secondary | ICD-10-CM | POA: Diagnosis not present

## 2017-12-26 DIAGNOSIS — Z811 Family history of alcohol abuse and dependence: Secondary | ICD-10-CM

## 2017-12-26 DIAGNOSIS — Z62810 Personal history of physical and sexual abuse in childhood: Secondary | ICD-10-CM | POA: Diagnosis not present

## 2017-12-26 DIAGNOSIS — R450 Nervousness: Secondary | ICD-10-CM

## 2017-12-26 DIAGNOSIS — R12 Heartburn: Secondary | ICD-10-CM

## 2017-12-26 DIAGNOSIS — R110 Nausea: Secondary | ICD-10-CM

## 2017-12-26 MED ORDER — LAMOTRIGINE 100 MG PO TABS: 100.0000 mg | ORAL_TABLET | Freq: Every day | ORAL | 0 refills | Status: DC

## 2017-12-26 MED ORDER — CLONAZEPAM 0.5 MG PO TABS: 0.5000 mg | ORAL_TABLET | Freq: Three times a day (TID) | ORAL | 2 refills | Status: DC | PRN

## 2017-12-26 MED ORDER — DULOXETINE HCL 60 MG PO CPEP: 60.0000 mg | ORAL_CAPSULE | Freq: Every day | ORAL | 0 refills | Status: DC
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
## 2018-01-06 DIAGNOSIS — M47817 Spondylosis without myelopathy or radiculopathy, lumbosacral region: Secondary | ICD-10-CM | POA: Diagnosis not present

## 2018-01-15 DIAGNOSIS — K257 Chronic gastric ulcer without hemorrhage or perforation: Secondary | ICD-10-CM | POA: Diagnosis not present

## 2018-01-15 DIAGNOSIS — Z86010 Personal history of colonic polyps: Secondary | ICD-10-CM | POA: Diagnosis not present

## 2018-01-15 DIAGNOSIS — K580 Irritable bowel syndrome with diarrhea: Secondary | ICD-10-CM | POA: Diagnosis not present

## 2018-01-16 DIAGNOSIS — M79602 Pain in left arm: Secondary | ICD-10-CM | POA: Diagnosis not present

## 2018-01-20 DIAGNOSIS — M47817 Spondylosis without myelopathy or radiculopathy, lumbosacral region: Secondary | ICD-10-CM | POA: Diagnosis not present

## 2018-02-03 DIAGNOSIS — M47817 Spondylosis without myelopathy or radiculopathy, lumbosacral region: Secondary | ICD-10-CM | POA: Diagnosis not present

## 2018-02-03 DIAGNOSIS — G894 Chronic pain syndrome: Secondary | ICD-10-CM | POA: Diagnosis not present

## 2018-02-03 DIAGNOSIS — Z79891 Long term (current) use of opiate analgesic: Secondary | ICD-10-CM | POA: Diagnosis not present

## 2018-02-03 DIAGNOSIS — Z79899 Other long term (current) drug therapy: Secondary | ICD-10-CM | POA: Diagnosis not present

## 2018-02-03 DIAGNOSIS — M79651 Pain in right thigh: Secondary | ICD-10-CM | POA: Diagnosis not present

## 2018-02-04 ENCOUNTER — Other Ambulatory Visit: Payer: Self-pay | Admitting: Pain Medicine

## 2018-02-06 ENCOUNTER — Other Ambulatory Visit: Payer: Self-pay | Admitting: Pain Medicine

## 2018-02-06 DIAGNOSIS — M25512 Pain in left shoulder: Secondary | ICD-10-CM

## 2018-02-06 DIAGNOSIS — M79602 Pain in left arm: Secondary | ICD-10-CM

## 2018-02-17 DIAGNOSIS — R319 Hematuria, unspecified: Secondary | ICD-10-CM | POA: Diagnosis not present

## 2018-02-17 DIAGNOSIS — Z6836 Body mass index (BMI) 36.0-36.9, adult: Secondary | ICD-10-CM | POA: Diagnosis not present

## 2018-02-17 DIAGNOSIS — Z01419 Encounter for gynecological examination (general) (routine) without abnormal findings: Secondary | ICD-10-CM | POA: Diagnosis not present

## 2018-02-18 ENCOUNTER — Ambulatory Visit
Admission: RE | Admit: 2018-02-18 | Discharge: 2018-02-18 | Disposition: A | Discharge status: Home / Self Care | Payer: 59 | Source: Ambulatory Visit | Attending: Pain Medicine | Admitting: Pain Medicine

## 2018-02-18 ENCOUNTER — Other Ambulatory Visit: Payer: Self-pay | Admitting: Pain Medicine

## 2018-02-18 DIAGNOSIS — M25512 Pain in left shoulder: Secondary | ICD-10-CM

## 2018-02-18 DIAGNOSIS — M79602 Pain in left arm: Secondary | ICD-10-CM

## 2018-02-18 DIAGNOSIS — M75112 Incomplete rotator cuff tear or rupture of left shoulder, not specified as traumatic: Secondary | ICD-10-CM | POA: Diagnosis not present

## 2018-03-03 DIAGNOSIS — Z79899 Other long term (current) drug therapy: Secondary | ICD-10-CM | POA: Diagnosis not present

## 2018-03-03 DIAGNOSIS — G894 Chronic pain syndrome: Secondary | ICD-10-CM | POA: Diagnosis not present

## 2018-03-03 DIAGNOSIS — M25512 Pain in left shoulder: Secondary | ICD-10-CM | POA: Diagnosis not present

## 2018-03-03 DIAGNOSIS — Z79891 Long term (current) use of opiate analgesic: Secondary | ICD-10-CM | POA: Diagnosis not present

## 2018-03-03 DIAGNOSIS — M79602 Pain in left arm: Secondary | ICD-10-CM | POA: Diagnosis not present

## 2018-03-22 ENCOUNTER — Other Ambulatory Visit (HOSPITAL_COMMUNITY): Payer: Self-pay | Admitting: Psychiatry

## 2018-03-22 DIAGNOSIS — F3131 Bipolar disorder, current episode depressed, mild: Secondary | ICD-10-CM

## 2018-03-23 ENCOUNTER — Other Ambulatory Visit (HOSPITAL_COMMUNITY): Payer: Self-pay | Admitting: Psychiatry

## 2018-03-23 DIAGNOSIS — F3131 Bipolar disorder, current episode depressed, mild: Secondary | ICD-10-CM

## 2018-03-26 ENCOUNTER — Encounter (HOSPITAL_COMMUNITY): Payer: Self-pay | Admitting: Psychiatry

## 2018-03-26 ENCOUNTER — Ambulatory Visit (INDEPENDENT_AMBULATORY_CARE_PROVIDER_SITE_OTHER): Payer: 59 | Admitting: Psychiatry

## 2018-03-26 DIAGNOSIS — F3131 Bipolar disorder, current episode depressed, mild: Secondary | ICD-10-CM

## 2018-03-26 DIAGNOSIS — F411 Generalized anxiety disorder: Secondary | ICD-10-CM

## 2018-03-26 DIAGNOSIS — Z653 Problems related to other legal circumstances: Secondary | ICD-10-CM | POA: Diagnosis not present

## 2018-03-26 DIAGNOSIS — F17210 Nicotine dependence, cigarettes, uncomplicated: Secondary | ICD-10-CM

## 2018-03-26 DIAGNOSIS — Z818 Family history of other mental and behavioral disorders: Secondary | ICD-10-CM

## 2018-03-26 DIAGNOSIS — Z811 Family history of alcohol abuse and dependence: Secondary | ICD-10-CM

## 2018-03-26 MED ORDER — DULOXETINE HCL 60 MG PO CPEP: 60.0000 mg | ORAL_CAPSULE | Freq: Every day | ORAL | 0 refills | Status: DC

## 2018-03-26 MED ORDER — LAMOTRIGINE 150 MG PO TABS: 150.0000 mg | ORAL_TABLET | Freq: Every day | ORAL | 0 refills | Status: DC

## 2018-03-26 MED ORDER — CLONAZEPAM 0.5 MG PO TABS: 0.5000 mg | ORAL_TABLET | Freq: Three times a day (TID) | ORAL | 2 refills | Status: DC | PRN
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
## 2018-03-31 ENCOUNTER — Other Ambulatory Visit (HOSPITAL_COMMUNITY): Payer: Self-pay | Admitting: Psychiatry

## 2018-04-03 DIAGNOSIS — M67814 Other specified disorders of tendon, left shoulder: Secondary | ICD-10-CM | POA: Diagnosis not present

## 2018-04-03 DIAGNOSIS — S43432A Superior glenoid labrum lesion of left shoulder, initial encounter: Secondary | ICD-10-CM | POA: Diagnosis not present

## 2018-04-03 DIAGNOSIS — G894 Chronic pain syndrome: Secondary | ICD-10-CM | POA: Diagnosis not present

## 2018-04-03 DIAGNOSIS — Z79899 Other long term (current) drug therapy: Secondary | ICD-10-CM | POA: Diagnosis not present

## 2018-04-03 DIAGNOSIS — Z79891 Long term (current) use of opiate analgesic: Secondary | ICD-10-CM | POA: Diagnosis not present

## 2018-04-03 DIAGNOSIS — M7582 Other shoulder lesions, left shoulder: Secondary | ICD-10-CM | POA: Diagnosis not present

## 2018-04-09 DIAGNOSIS — Z86010 Personal history of colonic polyps: Secondary | ICD-10-CM | POA: Diagnosis not present

## 2018-04-09 DIAGNOSIS — K257 Chronic gastric ulcer without hemorrhage or perforation: Secondary | ICD-10-CM | POA: Diagnosis not present

## 2018-04-09 DIAGNOSIS — K580 Irritable bowel syndrome with diarrhea: Secondary | ICD-10-CM | POA: Diagnosis not present

## 2018-04-15 DIAGNOSIS — M47816 Spondylosis without myelopathy or radiculopathy, lumbar region: Secondary | ICD-10-CM | POA: Diagnosis not present

## 2018-04-15 DIAGNOSIS — M7582 Other shoulder lesions, left shoulder: Secondary | ICD-10-CM | POA: Diagnosis not present

## 2018-04-15 DIAGNOSIS — G894 Chronic pain syndrome: Secondary | ICD-10-CM | POA: Diagnosis not present

## 2018-04-15 DIAGNOSIS — Z79899 Other long term (current) drug therapy: Secondary | ICD-10-CM | POA: Diagnosis not present

## 2018-04-15 DIAGNOSIS — Z79891 Long term (current) use of opiate analgesic: Secondary | ICD-10-CM | POA: Diagnosis not present

## 2018-05-06 DIAGNOSIS — Z79891 Long term (current) use of opiate analgesic: Secondary | ICD-10-CM | POA: Diagnosis not present

## 2018-05-06 DIAGNOSIS — Z79899 Other long term (current) drug therapy: Secondary | ICD-10-CM | POA: Diagnosis not present

## 2018-05-06 DIAGNOSIS — M25512 Pain in left shoulder: Secondary | ICD-10-CM | POA: Diagnosis not present

## 2018-05-06 DIAGNOSIS — M545 Low back pain: Secondary | ICD-10-CM | POA: Diagnosis not present

## 2018-05-06 DIAGNOSIS — G894 Chronic pain syndrome: Secondary | ICD-10-CM | POA: Diagnosis not present

## 2018-05-21 DIAGNOSIS — K257 Chronic gastric ulcer without hemorrhage or perforation: Secondary | ICD-10-CM | POA: Diagnosis not present

## 2018-05-21 DIAGNOSIS — K580 Irritable bowel syndrome with diarrhea: Secondary | ICD-10-CM | POA: Diagnosis not present

## 2018-05-21 DIAGNOSIS — R1013 Epigastric pain: Secondary | ICD-10-CM | POA: Diagnosis not present

## 2018-06-17 DIAGNOSIS — Z79891 Long term (current) use of opiate analgesic: Secondary | ICD-10-CM | POA: Diagnosis not present

## 2018-06-17 DIAGNOSIS — G894 Chronic pain syndrome: Secondary | ICD-10-CM | POA: Diagnosis not present

## 2018-06-17 DIAGNOSIS — M545 Low back pain: Secondary | ICD-10-CM | POA: Diagnosis not present

## 2018-06-17 DIAGNOSIS — Z79899 Other long term (current) drug therapy: Secondary | ICD-10-CM | POA: Diagnosis not present

## 2018-06-17 DIAGNOSIS — M25512 Pain in left shoulder: Secondary | ICD-10-CM | POA: Diagnosis not present

## 2018-06-18 ENCOUNTER — Other Ambulatory Visit (HOSPITAL_COMMUNITY): Payer: Self-pay | Admitting: Psychiatry

## 2018-06-18 DIAGNOSIS — F3131 Bipolar disorder, current episode depressed, mild: Secondary | ICD-10-CM

## 2018-06-19 ENCOUNTER — Other Ambulatory Visit (HOSPITAL_COMMUNITY): Payer: Self-pay

## 2018-06-19 DIAGNOSIS — F3131 Bipolar disorder, current episode depressed, mild: Secondary | ICD-10-CM

## 2018-06-19 MED ORDER — CLONAZEPAM 0.5 MG PO TABS: 0.5000 mg | ORAL_TABLET | Freq: Three times a day (TID) | ORAL | 0 refills | Status: DC | PRN

## 2018-06-21 ENCOUNTER — Other Ambulatory Visit (HOSPITAL_COMMUNITY): Payer: Self-pay | Admitting: Psychiatry

## 2018-06-21 DIAGNOSIS — F3131 Bipolar disorder, current episode depressed, mild: Secondary | ICD-10-CM

## 2018-06-25 ENCOUNTER — Other Ambulatory Visit (HOSPITAL_COMMUNITY): Payer: Self-pay

## 2018-06-25 ENCOUNTER — Ambulatory Visit (HOSPITAL_COMMUNITY): Payer: 59 | Admitting: Psychiatry

## 2018-06-25 DIAGNOSIS — F3131 Bipolar disorder, current episode depressed, mild: Secondary | ICD-10-CM

## 2018-06-25 MED ORDER — DULOXETINE HCL 60 MG PO CPEP: 60.0000 mg | ORAL_CAPSULE | Freq: Every day | ORAL | 0 refills | Status: DC

## 2018-06-25 MED ORDER — LAMOTRIGINE 150 MG PO TABS
150.0000 mg | ORAL_TABLET | Freq: Every day | ORAL | 0 refills | Status: DC
Start: 2018-06-25 — End: 2018-07-17

## 2018-07-15 DIAGNOSIS — M67814 Other specified disorders of tendon, left shoulder: Secondary | ICD-10-CM | POA: Diagnosis not present

## 2018-07-15 DIAGNOSIS — Z79899 Other long term (current) drug therapy: Secondary | ICD-10-CM | POA: Diagnosis not present

## 2018-07-15 DIAGNOSIS — Z79891 Long term (current) use of opiate analgesic: Secondary | ICD-10-CM | POA: Diagnosis not present

## 2018-07-15 DIAGNOSIS — M47816 Spondylosis without myelopathy or radiculopathy, lumbar region: Secondary | ICD-10-CM | POA: Diagnosis not present

## 2018-07-15 DIAGNOSIS — G894 Chronic pain syndrome: Secondary | ICD-10-CM | POA: Diagnosis not present

## 2018-07-17 ENCOUNTER — Ambulatory Visit (HOSPITAL_COMMUNITY): Payer: 59 | Admitting: Psychiatry

## 2018-07-17 ENCOUNTER — Encounter (HOSPITAL_COMMUNITY): Payer: Self-pay | Admitting: Psychiatry

## 2018-07-17 VITALS — BP 130/74 | HR 74 | Ht 65.0 in | Wt 215.0 lb

## 2018-07-17 DIAGNOSIS — Z818 Family history of other mental and behavioral disorders: Secondary | ICD-10-CM

## 2018-07-17 DIAGNOSIS — F3131 Bipolar disorder, current episode depressed, mild: Secondary | ICD-10-CM | POA: Diagnosis not present

## 2018-07-17 DIAGNOSIS — Z811 Family history of alcohol abuse and dependence: Secondary | ICD-10-CM

## 2018-07-17 DIAGNOSIS — R0683 Snoring: Secondary | ICD-10-CM

## 2018-07-17 DIAGNOSIS — G4719 Other hypersomnia: Secondary | ICD-10-CM

## 2018-07-17 DIAGNOSIS — F17210 Nicotine dependence, cigarettes, uncomplicated: Secondary | ICD-10-CM | POA: Diagnosis not present

## 2018-07-17 MED ORDER — DULOXETINE HCL 60 MG PO CPEP: 60.0000 mg | ORAL_CAPSULE | ORAL | 0 refills | Status: AC

## 2018-07-17 MED ORDER — CLONAZEPAM 1 MG PO TABS: 1.0000 mg | ORAL_TABLET | Freq: Two times a day (BID) | ORAL | 1 refills | Status: AC

## 2018-07-17 MED ORDER — ZOLPIDEM TARTRATE 10 MG PO TABS
10.0000 mg | ORAL_TABLET | Freq: Every evening | ORAL | 1 refills | Status: AC | PRN
Start: 2018-07-17 — End: ?

## 2018-07-17 NOTE — Progress Notes (Signed)
[facility] MD/PA/NP OP Progress Note  07/17/2018 4:22 PM Melinda Obrien  MRN:  [id number]  Chief Complaint: cross coverage med check  HPI: LEON GOODNOW continues to struggle with anxiety, feels like the Lamictal has never added any benefit.  Has titrated to 150 mg without any side effects but also no benefit.  We agreed to discontinue and titrate clonazepam to 1 mg twice a day.  She denies any suicidality, continues to contend with worrying about her son who has temporal lobe epilepsy.  She has good support from her husband, and has some goals for herself that she wants to return to work if possible.  We agreed to restart Ambien nightly for sleep which she reports has been helpful for her in the past.  She does not drink alcohol or use any substances of abuse.  We will follow-up in 2-3 months or sooner if needed.  She has ongoing difficulty sleeping due to snoring but also restless leg syndrome, and she struggles with daytime headaches and fatigue.  I educated her on sleep apnea and she is concerned that she might have this.  We agreed to proceed with a sleep study.  Visit Diagnosis:    ICD-10-CM   1. Snoring R06.83 Home sleep test  2. Bipolar affective disorder, currently depressed, mild (HCC) F31.31 clonazePAM (KLONOPIN) 1 MG tablet    DULoxetine (CYMBALTA) 60 MG capsule    zolpidem (AMBIEN) 10 MG tablet  3. Excessive daytime sleepiness G47.19 Home sleep test    Past Psychiatric History: See intake H&P for full details. Reviewed, with no updates at this time.   Past Medical History:  Past Medical History:  Diagnosis Date  . Anxiety   . Arthritis   . Back pain, chronic   . Bipolar 1 disorder ([place])   . Bronchitis, acute   . Depression     Past Surgical History:  Procedure Laterality Date  . APPENDECTOMY    . ARCUATE KERATECTOMY    . BACK SURGERY  2010   lumb fusion  . KNEE ARTHROSCOPY WITH LATERAL MENISECTOMY Right 10/26/2013   Procedure: KNEE ARTHROSCOPY WITH LATERAL MENISECTOMY;   Surgeon: Kerin Salen, MD;  Location: [facility];  Service: Orthopedics;  Laterality: Right;  Partial medial and lateral menisectomies and chondroplasty patella  . TUBAL LIGATION      Family Psychiatric History: See intake H&P for full details. Reviewed, with no updates at this time.   Family History:  Family History  Problem Relation Age of Onset  . Heart failure Father   . Cancer Other   . Heart failure Other   . Alcohol abuse Maternal Aunt   . Alcohol abuse Paternal Aunt   . Alcohol abuse Maternal Uncle   . Alcohol abuse Paternal Uncle   . Bipolar disorder Cousin   . Alcohol abuse Cousin     Social History:  Social History   Socioeconomic History  . Marital status: Married    Spouse name: Not on file  . Number of children: Not on file  . Years of education: Not on file  . Highest education level: Not on file  Occupational History  . Not on file  Social Needs  . Financial resource strain: Not on file  . Food insecurity:    Worry: Not on file    Inability: Not on file  . Transportation needs:    Medical: Not on file    Non-medical: Not on file  Tobacco Use  . Smoking status: Current  Every Day Smoker    Packs/day: 1.00    Last attempt to quit: 03/07/2015    Years since quitting: 3.3  . Smokeless tobacco: Never Used  . Tobacco comment: Has cut back to 1-2 a day.  Substance and Sexual Activity  . Alcohol use: No    Alcohol/week: 0.0 standard drinks  . Drug use: No  . Sexual activity: Yes  Lifestyle  . Physical activity:    Days per week: Not on file    Minutes per session: Not on file  . Stress: Not on file  Relationships  . Social connections:    Talks on phone: Not on file    Gets together: Not on file    Attends religious service: Not on file    Active member of club or organization: Not on file    Attends meetings of clubs or organizations: Not on file    Relationship status: Not on file  Other Topics Concern  . Not on file  Social  History Narrative  . Not on file    Allergies:  Allergies  Allergen Reactions  . Amoxicillin Hives  . Penicillins Hives  . Sulfa Antibiotics Itching    Metabolic Disorder Labs: Lab Results  Component Value Date   HGBA1C 5.6 04/07/2015   MPG 114 04/07/2015   No results found for: PROLACTIN Lab Results  Component Value Date   CHOL  01/26/2011    174        ATP III CLASSIFICATION:  <200     mg/dL   Desirable  200-239  mg/dL   Borderline High  >=240    mg/dL   High          TRIG 159 (H) 01/26/2011   HDL 35 (L) 01/26/2011   CHOLHDL 5.0 01/26/2011   VLDL 32 01/26/2011   LDLCALC (H) 01/26/2011    107        Total Cholesterol/HDL:CHD Risk Coronary Heart Disease Risk Table                     Men   Women  1/2 Average Risk   3.4   3.3  Average Risk       5.0   4.4  2 X Average Risk   9.6   7.1  3 X Average Risk  23.4   11.0        Use the calculated Patient Ratio above and the CHD Risk Table to determine the patient's CHD Risk.        ATP III CLASSIFICATION (LDL):  <100     mg/dL   Optimal  100-129  mg/dL   Near or Above                    Optimal  130-159  mg/dL   Borderline  160-189  mg/dL   High  >190     mg/dL   Very High   No results found for: TSH  Therapeutic Level Labs: No results found for: LITHIUM Lab Results  Component Value Date   VALPROATE <12.5 (L) 04/07/2015   No components found for:  CBMZ  Current Medications: Current Outpatient Medications  Medication Sig Dispense Refill  . ciprofloxacin (CIPRO) 500 MG tablet TAKE 1 TABLET BY MOUTH EVERY 12 HOURS FOR 14 DAYS  0  . clonazePAM (KLONOPIN) 1 MG tablet Take 1 tablet (1 mg total) by mouth 2 (two) times daily. 60 tablet 1  . cloNIDine (CATAPRES - DOSED IN MG/24  HR) 0.2 mg/24hr patch APPLY ONE PATCH WEEKLY AS NEEDED  0  . colestipol (COLESTID) 1 g tablet Take 2 g by mouth at bedtime.  1  . dicyclomine (BENTYL) 10 MG capsule TAKE 2 CAPSULES 3 TIMES A DAY AS NEEDED  2  . DULoxetine (CYMBALTA) 60  MG capsule Take 1 capsule (60 mg total) by mouth every morning. 90 capsule 0  . estradiol (VIVELLE-DOT) 0.0375 MG/24HR APPLY 1 PATCH TO SKIN 2 TIMES EVERY WEEK AS DIRECTED  0  . fluorouracil (EFUDEX) 5 % cream APPLY TO AFFECTED AREA TWICE A DAY FOR 2 WEEKS  0  . HYDROcodone-acetaminophen (NORCO) 7.5-325 MG tablet Take 1 tablet by mouth 3 (three) times daily as needed.  0  . HYSINGLA ER 20 MG T24A Take 1 tablet by mouth daily.  0  . MORPHABOND ER 15 MG T12A Take 1 tablet by mouth 2 (two) times daily.  0  . pantoprazole (PROTONIX) 40 MG tablet Take 40 mg by mouth 2 (two) times daily.  1  . progesterone (PROMETRIUM) 100 MG capsule Take 100 mg by mouth at bedtime.  11  . valACYclovir (VALTREX) 500 MG tablet Take 500 mg by mouth daily.  0  . VIBERZI 100 MG TABS Take 1 tablet by mouth 2 (two) times daily.  1  . zolpidem (AMBIEN) 10 MG tablet Take 1 tablet (10 mg total) by mouth at bedtime as needed for sleep. 30 tablet 1   No current facility-administered medications for this visit.      Musculoskeletal: Strength & Muscle Tone: within normal limits Gait & Station: normal Patient leans: N/A  Psychiatric Specialty Exam: ROS  Blood pressure 130/74, pulse 74, height 5\' 5"  (1.651 m), weight 215 lb (97.5 kg), last menstrual period 12/06/2011.Body mass index is 35.78 kg/m.  General Appearance: Casual and Well Groomed  Eye Contact:  Good  Speech:  Clear and Coherent and Normal Rate  Volume:  Normal  Mood:  Anxious and worried  Affect:  Congruent  Thought Process:  Goal Directed and Descriptions of Associations: Intact  Orientation:  Full (Time, Place, and Person)  Thought Content: Logical   Suicidal Thoughts:  No  Homicidal Thoughts:  No  Memory:  Immediate;   Good  Judgement:  Good  Insight:  Fair  Psychomotor Activity:  Normal  Concentration:  Concentration: Good  Recall:  Good  Fund of Knowledge: Good  Language: Good  Akathisia:  Negative  Handed:  Right  AIMS (if indicated): not  done  Assets:  Communication Skills Desire for Improvement Financial Resources/Insurance Housing  ADL's:  Intact  Cognition: WNL  Sleep:  Poor   Screenings:   Assessment and Plan:  JELIYAH MIDDLEBROOKS presents with ongoing anxiety and worry about her son, ongoing restless leg syndrome and difficulty sleeping.  I educated her on the importance of taking Cymbalta in the morning to avoid restless leg syndrome symptoms.  Nonetheless I am concerned about her heavy snoring that sometimes wakes her up with coughing and choking sensations.  We agreed to proceed with a sleep study, and titration of medications as below to address ongoing anxiety.  1. Snoring   2. Bipolar affective disorder, currently depressed, mild ([place])   3. Excessive daytime sleepiness     Status of current problems: unchanged  Labs Ordered: Orders Placed This Encounter  Procedures  . Home sleep test    Standing Status:   Future    Standing Expiration Date:   07/18/2019    Order Specific Question:  Where should this test be performed:    Answer:   [place] Reviewed: NA  Collateral Obtained/Records Reviewed: Dr. Adele Schilder prior notes  Plan:  Cymbalta 60 mg in the morning Clonazepam 1 mg twice a day Ambien 10 mg nightly Stop Lamictal given lack of benefit Follow-up in 8-10 weeks  Aundra Dubin, MD 07/17/2018, 4:22 PM

## 2018-07-28 DIAGNOSIS — D126 Benign neoplasm of colon, unspecified: Secondary | ICD-10-CM | POA: Diagnosis not present

## 2018-07-28 DIAGNOSIS — K257 Chronic gastric ulcer without hemorrhage or perforation: Secondary | ICD-10-CM | POA: Diagnosis not present

## 2018-07-28 DIAGNOSIS — K228 Other specified diseases of esophagus: Secondary | ICD-10-CM | POA: Diagnosis not present

## 2018-07-28 DIAGNOSIS — K2930 Chronic superficial gastritis without bleeding: Secondary | ICD-10-CM | POA: Diagnosis not present

## 2018-08-05 DIAGNOSIS — M47816 Spondylosis without myelopathy or radiculopathy, lumbar region: Secondary | ICD-10-CM | POA: Diagnosis not present

## 2018-08-06 DIAGNOSIS — M47817 Spondylosis without myelopathy or radiculopathy, lumbosacral region: Secondary | ICD-10-CM | POA: Diagnosis not present

## 2018-08-06 DIAGNOSIS — Z79899 Other long term (current) drug therapy: Secondary | ICD-10-CM | POA: Diagnosis not present

## 2018-08-06 DIAGNOSIS — M47816 Spondylosis without myelopathy or radiculopathy, lumbar region: Secondary | ICD-10-CM | POA: Diagnosis not present

## 2018-08-06 DIAGNOSIS — G894 Chronic pain syndrome: Secondary | ICD-10-CM | POA: Diagnosis not present

## 2018-08-06 DIAGNOSIS — Z79891 Long term (current) use of opiate analgesic: Secondary | ICD-10-CM | POA: Diagnosis not present

## 2018-08-06 DIAGNOSIS — M25512 Pain in left shoulder: Secondary | ICD-10-CM | POA: Diagnosis not present

## 2018-08-20 ENCOUNTER — Ambulatory Visit (HOSPITAL_BASED_OUTPATIENT_CLINIC_OR_DEPARTMENT_OTHER): Payer: 59 | Attending: Psychiatry | Admitting: Internal Medicine

## 2018-08-20 DIAGNOSIS — R0902 Hypoxemia: Secondary | ICD-10-CM | POA: Diagnosis not present

## 2018-08-20 DIAGNOSIS — M79671 Pain in right foot: Secondary | ICD-10-CM | POA: Diagnosis not present

## 2018-08-20 DIAGNOSIS — M545 Low back pain: Secondary | ICD-10-CM | POA: Diagnosis not present

## 2018-08-20 DIAGNOSIS — G4710 Hypersomnia, unspecified: Secondary | ICD-10-CM | POA: Insufficient documentation

## 2018-08-20 DIAGNOSIS — G4733 Obstructive sleep apnea (adult) (pediatric): Secondary | ICD-10-CM | POA: Diagnosis not present

## 2018-08-20 DIAGNOSIS — G4719 Other hypersomnia: Secondary | ICD-10-CM

## 2018-08-20 DIAGNOSIS — R0683 Snoring: Secondary | ICD-10-CM

## 2018-08-25 DIAGNOSIS — M961 Postlaminectomy syndrome, not elsewhere classified: Secondary | ICD-10-CM | POA: Diagnosis not present

## 2018-08-25 DIAGNOSIS — Z79891 Long term (current) use of opiate analgesic: Secondary | ICD-10-CM | POA: Diagnosis not present

## 2018-08-25 DIAGNOSIS — M47817 Spondylosis without myelopathy or radiculopathy, lumbosacral region: Secondary | ICD-10-CM | POA: Diagnosis not present

## 2018-08-25 DIAGNOSIS — Z79899 Other long term (current) drug therapy: Secondary | ICD-10-CM | POA: Diagnosis not present

## 2018-08-25 DIAGNOSIS — G894 Chronic pain syndrome: Secondary | ICD-10-CM | POA: Diagnosis not present

## 2018-08-30 DIAGNOSIS — G4733 Obstructive sleep apnea (adult) (pediatric): Secondary | ICD-10-CM | POA: Diagnosis not present

## 2018-08-30 NOTE — Procedures (Signed)
  Patient Name: Melinda Obrien, [place] Date: 08/20/2018 Gender: Female D.O.B: 05/19/1961 Age (years): 57 Referring Provider: Lulu Riding Height (inches): 84 Interpreting Physician: Baird Lyons MD, ABS[facility] Weight (lbs): 215 RPSGT: Jonna Coup BMI: 36 MRN: [id number] Neck Size: <br>  CLINICAL INFORMATION Sleep Study Type: HST Indication for sleep study: Excessive Daytime Sleepiness, Snoring  Epworth Sleepiness Score:7  SLEEP STUDY TECHNIQUE A multi-channel overnight portable sleep study was performed. The channels recorded were: nasal airflow, thoracic respiratory movement, and oxygen saturation with a pulse oximetry. Snoring was also monitored.  MEDICATIONS Patient self administered medications include: none reported.  SLEEP ARCHITECTURE Patient was studied for 596.1 minutes. The sleep efficiency was 99.3 % and the patient was supine for 60.8%. The arousal index was 0.0 per hour.  RESPIRATORY PARAMETERS The overall AHI was 8.7 per hour, with a central apnea index of 0.0 per hour.  The oxygen nadir was 62% during sleep.  CARDIAC DATA Mean heart rate during sleep was 73.1 bpm.  IMPRESSIONS - Mild obstructive sleep apnea occurred during this study (AHI = 8.7/h). - No significant central sleep apnea occurred during this study (CAI = 0.0/h). - Oxygen desaturation was noted during this study (Min O2 = 62%, Mean 91%). Time - Patient snored.  DIAGNOSIS - Obstructive Sleep Apnea (327.23 [G47.33 ICD-10]) - Nocturnal hypoxemia  RECOMMENDATIONS - Treatment for mild OSA is directed at symptoms with consideration of co-morbidities. Conservative management might include observation, weight loss, sleep position off back. Sustained oxygen desaturation suggests possibility of underlying cardiopulmonary disease, so a CPAP titration sleep study might address the OSA and also determine if this corrects the oxygen desaturation. - Be careful with alcohol, sedatives and other CNS  depressants that may worsen sleep apnea and disrupt normal sleep architecture. - Sleep hygiene should be reviewed to assess factors that may improve sleep quality. - Weight management and regular exercise should be initiated or continued.  [Electronically signed] 08/30/2018 12:48 PM  Baird Lyons MD, [place], American Board of Sleep Medicine   NPI: [id number]                           [place], [address] [facility]  ELECTRONICALLY SIGNED ON:  08/30/2018, 12:42 PM [place] PH: [phone]   FX: [phone] [place]

## 2018-09-17 ENCOUNTER — Emergency Department (HOSPITAL_COMMUNITY): Payer: 59

## 2018-09-17 ENCOUNTER — Encounter (HOSPITAL_COMMUNITY): Payer: Self-pay | Admitting: *Deleted

## 2018-09-17 ENCOUNTER — Emergency Department (HOSPITAL_COMMUNITY)
Admission: EM | Admit: 2018-09-17 | Discharge: 2018-09-17 | Disposition: A | Discharge status: Home / Self Care | Payer: 59 | Attending: Emergency Medicine | Admitting: Emergency Medicine

## 2018-09-17 ENCOUNTER — Other Ambulatory Visit: Payer: Self-pay

## 2018-09-17 DIAGNOSIS — M7918 Myalgia, other site: Secondary | ICD-10-CM | POA: Diagnosis not present

## 2018-09-17 DIAGNOSIS — Z79899 Other long term (current) drug therapy: Secondary | ICD-10-CM | POA: Diagnosis not present

## 2018-09-17 DIAGNOSIS — M545 Low back pain: Secondary | ICD-10-CM | POA: Diagnosis not present

## 2018-09-17 DIAGNOSIS — M25512 Pain in left shoulder: Secondary | ICD-10-CM | POA: Diagnosis not present

## 2018-09-17 DIAGNOSIS — S3992XA Unspecified injury of lower back, initial encounter: Secondary | ICD-10-CM | POA: Diagnosis not present

## 2018-09-17 DIAGNOSIS — F17200 Nicotine dependence, unspecified, uncomplicated: Secondary | ICD-10-CM | POA: Insufficient documentation

## 2018-09-17 NOTE — Discharge Instructions (Addendum)
Please read attached information. If you experience any new or worsening signs or symptoms please return to the emergency room for evaluation. Please follow-up with your primary care provider or specialist as discussed.  °

## 2018-09-17 NOTE — ED Provider Notes (Signed)
[facility] EMERGENCY DEPARTMENT Provider Note   CSN: [id number] Arrival date & time: 09/17/18  1025   History   Chief Complaint Chief Complaint  Patient presents with  . Fall    HPI Melinda Obrien is a 57 y.o. female.  HPI   7 YOM presents today SP fall.  Patient notes that she was out of town this weekend at a hotel when she slipped in the bathtub landing on her left side.  She notes pain to her shoulder left buttocks and lower back.  She denies any distal neurological deficits, reports she has ambulated without difficulty since then.  She notes she fell again twisting her right ankle after tripping upstairs.  She was referred here from her pain management specialist to have evaluation.  Patient notes the pain in her lower back is left lower, with no significant midline tenderness.  She notes taking her hydrocodone prior to evaluation today.  Past Medical History:  Diagnosis Date  . Anxiety   . Arthritis   . Back pain, chronic   . Bipolar 1 disorder ([place])   . Bronchitis, acute   . Depression     There are no active problems to display for this patient.   Past Surgical History:  Procedure Laterality Date  . APPENDECTOMY    . ARCUATE KERATECTOMY    . BACK SURGERY  2010   lumb fusion  . KNEE ARTHROSCOPY WITH LATERAL MENISECTOMY Right 10/26/2013   Procedure: KNEE ARTHROSCOPY WITH LATERAL MENISECTOMY;  Surgeon: Kerin Salen, MD;  Location: [facility];  Service: Orthopedics;  Laterality: Right;  Partial medial and lateral menisectomies and chondroplasty patella  . TUBAL LIGATION       OB History   None      Home Medications    Prior to Admission medications   Medication Sig Start Date End Date Taking? Authorizing Provider  ciprofloxacin (CIPRO) 500 MG tablet TAKE 1 TABLET BY MOUTH EVERY 12 HOURS FOR 14 DAYS 11/08/17   [provider]  clonazePAM (KLONOPIN) 1 MG tablet Take 1 tablet (1 mg total) by mouth 2 (two) times  daily. 07/17/18   Eksir, Richard Miu, MD  cloNIDine (CATAPRES - DOSED IN MG/24 HR) 0.2 mg/24hr patch APPLY ONE PATCH WEEKLY AS NEEDED 06/06/18   [provider]  colestipol (COLESTID) 1 g tablet Take 2 g by mouth at bedtime. 12/23/17   [provider]  dicyclomine (BENTYL) 10 MG capsule TAKE 2 CAPSULES 3 TIMES A DAY AS NEEDED 12/22/17   [provider]  DULoxetine (CYMBALTA) 60 MG capsule Take 1 capsule (60 mg total) by mouth every morning. 07/17/18   Eksir, Richard Miu, MD  estradiol (VIVELLE-DOT) 0.0375 MG/24HR APPLY 1 PATCH TO SKIN 2 TIMES EVERY WEEK AS DIRECTED 12/23/17   [provider]  fluorouracil (EFUDEX) 5 % cream APPLY TO AFFECTED AREA TWICE A DAY FOR 2 WEEKS 10/18/17   [provider]  HYDROcodone-acetaminophen (NORCO) 7.5-325 MG tablet Take 1 tablet by mouth 3 (three) times daily as needed. 12/06/17   [provider]  [facility] ER 20 MG T24A Take 1 tablet by mouth daily. 05/21/18   [provider]  MORPHABOND ER 15 MG T12A Take 1 tablet by mouth 2 (two) times daily. 06/18/18   [provider]  pantoprazole (PROTONIX) 40 MG tablet Take 40 mg by mouth 2 (two) times daily. 10/01/17   [provider]  progesterone (PROMETRIUM) 100 MG capsule Take 100 mg by mouth at  bedtime. 03/02/15   [provider]  valACYclovir (VALTREX) 500 MG tablet Take 500 mg by mouth daily. 11/25/17   [provider]  VIBERZI 100 MG TABS Take 1 tablet by mouth 2 (two) times daily. 06/01/18   [provider]  zolpidem (AMBIEN) 10 MG tablet Take 1 tablet (10 mg total) by mouth at bedtime as needed for sleep. 07/17/18   Eksir, Richard Miu, MD    Family History Family History  Problem Relation Age of Onset  . Heart failure Father   . Cancer Other   . Heart failure Other   . Alcohol abuse Maternal Aunt   . Alcohol abuse Paternal Aunt   . Alcohol abuse Maternal Uncle   . Alcohol abuse Paternal Uncle   . Bipolar  disorder Cousin   . Alcohol abuse Cousin     Social History Social History   Tobacco Use  . Smoking status: Current Every Day Smoker    Packs/day: 1.00    Last attempt to quit: 03/07/2015    Years since quitting: 3.5  . Smokeless tobacco: Never Used  . Tobacco comment: Has cut back to 1-2 a day.  Substance Use Topics  . Alcohol use: No    Alcohol/week: 0.0 standard drinks  . Drug use: No     Allergies   Amoxicillin; Penicillins; and Sulfa antibiotics   Review of Systems Review of Systems  All other systems reviewed and are negative.   Physical Exam Updated Vital Signs BP 135/69   Pulse 87   Temp 98 F (36.7 C) (Oral)   Resp 16   LMP 12/06/2011   SpO2 93%   Physical Exam  Constitutional: She is oriented to person, place, and time. She appears well-developed and well-nourished.  HENT:  Head: Normocephalic and atraumatic.  Eyes: Pupils are equal, round, and reactive to light. Conjunctivae are normal. Right eye exhibits no discharge. Left eye exhibits no discharge. No scleral icterus.  Neck: Normal range of motion. No JVD present. No tracheal deviation present.  Pulmonary/Chest: Effort normal. No stridor.  Musculoskeletal:  Left shoulder atraumatic - normal ROM - minor ttp of left deltoid - distal sensation strength motor function intact-no CT or L-spine tenderness palpation, tenderness palpation left lateral lumbar soft tissue and upper gluteus, straight leg negative, distal sensation strength motor function intact-right lateral ankle and malleolus with minor swelling and tenderness palpation, no medial malleolar tenderness, no significant tenderness to the foot  Neurological: She is alert and oriented to person, place, and time. Coordination normal.  Psychiatric: She has a normal mood and affect. Her behavior is normal. Judgment and thought content normal.  Nursing note and vitals reviewed.    ED Treatments / Results  Labs (all labs ordered are listed, but only  abnormal results are displayed) Labs Reviewed - No data to display  EKG None  Radiology Dg Lumbar Spine Complete  Result Date: 09/17/2018 CLINICAL DATA:  57 year old female status post fall in bathtub five days ago. Left side pain radiating from the low back down the leg. Prior spine surgery. EXAM: LUMBAR SPINE - COMPLETE 4+ VIEW COMPARISON:  CT Abdomen and Pelvis 10/17/2017. FINDINGS: Normal lumbar segmentation. Prior anterior interbody fusion at L4-L5 with solid arthrodesis. Stable hardware. Chronic mild retrolisthesis with endplate spurring and vacuum disc at L3-L4. Chronic disc and endplate degeneration also at L1-L2. moderate to severe chronic facet degeneration at L5-S1. 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 Vacuum facet there in 2018. No acute osseous abnormality identified. Right lower  quadrant surgical clips. The wise negative visible abdominal visceral contours. IMPRESSION: 1.  No acute osseous abnormality identified in the lumbar spine. 2. Chronic L4-L5 fusion with solid arthrodesis. Advanced disc or facet degeneration at the adjacent segments. Electronically Signed   By: Genevie Ann M.D.   On: 09/17/2018 13:40   Dg Ankle Complete Right  Result Date: 09/17/2018 CLINICAL DATA:  57 year old female status post fall out of bathtub 5 days ago. Pain. EXAM: RIGHT ANKLE - COMPLETE 3+ VIEW COMPARISON:  None. FINDINGS: Preserved mortise joint alignment. Talar dome intact. Bone mineralization is within normal limits. Calcaneus intact with degenerative spurring. Small chronic appearing ossific fragment at the medial malleolus. No acute fracture of the distal tibia or fibula. Visible bones of the right foot appear intact. IMPRESSION: No acute fracture or dislocation identified about the right ankle. Electronically Signed   By: Genevie Ann M.D.   On: 09/17/2018 13:41    Procedures Procedures (including critical care time)  Medications Ordered in ED Medications - No data to  display   Initial Impression / Assessment and Plan / ED Course  I have reviewed the triage vital signs and the nursing notes.  Pertinent labs & imaging results that were available during my care of the patient were reviewed by me and considered in my medical decision making (see chart for details).     Labs:   Imaging: DG lumbar spine complete, DG ankle complete right  Consults:  Therapeutics:  Discharge Meds:   Assessment/Plan: 57 year old female presents status post fall patient with likely muscular pain.  No acute bony abnormalities.  Discharged with outpatient follow-up and return precautions.  She verbalized understanding and agreement to today's plan.    Final Clinical Impressions(s) / ED Diagnoses   Final diagnoses:  Musculoskeletal pain    ED Discharge Orders    None       Okey Regal, PA-C 09/17/18 1628    Varney Biles, MD 09/20/18 [phone]

## 2018-09-17 NOTE — ED Triage Notes (Signed)
Pt in stating she fell out of a bathtub on Friday, c/o pain to her "entire left side" of her body, landed on her hip/buttocks area, pain radiates from her lower back down her leg, ambulatory and moving all extremities

## 2018-09-22 DIAGNOSIS — K52832 Lymphocytic colitis: Secondary | ICD-10-CM | POA: Diagnosis not present

## 2018-09-22 DIAGNOSIS — K257 Chronic gastric ulcer without hemorrhage or perforation: Secondary | ICD-10-CM | POA: Diagnosis not present

## 2018-09-22 DIAGNOSIS — K580 Irritable bowel syndrome with diarrhea: Secondary | ICD-10-CM | POA: Diagnosis not present

## 2018-10-02 DIAGNOSIS — G894 Chronic pain syndrome: Secondary | ICD-10-CM | POA: Diagnosis not present

## 2018-10-02 DIAGNOSIS — M961 Postlaminectomy syndrome, not elsewhere classified: Secondary | ICD-10-CM | POA: Diagnosis not present

## 2018-10-02 DIAGNOSIS — E669 Obesity, unspecified: Secondary | ICD-10-CM | POA: Diagnosis not present

## 2018-10-02 DIAGNOSIS — M47817 Spondylosis without myelopathy or radiculopathy, lumbosacral region: Secondary | ICD-10-CM | POA: Diagnosis not present

## 2018-10-06 DIAGNOSIS — G894 Chronic pain syndrome: Secondary | ICD-10-CM | POA: Diagnosis not present

## 2018-10-06 DIAGNOSIS — M47816 Spondylosis without myelopathy or radiculopathy, lumbar region: Secondary | ICD-10-CM | POA: Diagnosis not present

## 2018-10-06 DIAGNOSIS — M25512 Pain in left shoulder: Secondary | ICD-10-CM | POA: Diagnosis not present

## 2018-10-15 DIAGNOSIS — M47816 Spondylosis without myelopathy or radiculopathy, lumbar region: Secondary | ICD-10-CM | POA: Diagnosis not present

## 2018-10-15 DIAGNOSIS — G894 Chronic pain syndrome: Secondary | ICD-10-CM | POA: Diagnosis not present

## 2018-10-15 DIAGNOSIS — M7582 Other shoulder lesions, left shoulder: Secondary | ICD-10-CM | POA: Diagnosis not present

## 2018-11-04 DIAGNOSIS — Z4542 Encounter for adjustment and management of neuropacemaker (brain) (peripheral nerve) (spinal cord): Secondary | ICD-10-CM | POA: Diagnosis not present

## 2018-11-04 DIAGNOSIS — Z79899 Other long term (current) drug therapy: Secondary | ICD-10-CM | POA: Diagnosis not present

## 2018-11-04 DIAGNOSIS — Z79891 Long term (current) use of opiate analgesic: Secondary | ICD-10-CM | POA: Diagnosis not present

## 2018-11-04 DIAGNOSIS — M47816 Spondylosis without myelopathy or radiculopathy, lumbar region: Secondary | ICD-10-CM | POA: Diagnosis not present

## 2018-11-04 DIAGNOSIS — G894 Chronic pain syndrome: Secondary | ICD-10-CM | POA: Diagnosis not present

## 2018-11-12 DIAGNOSIS — S335XXA Sprain of ligaments of lumbar spine, initial encounter: Secondary | ICD-10-CM | POA: Diagnosis not present

## 2018-11-12 DIAGNOSIS — M5441 Lumbago with sciatica, right side: Secondary | ICD-10-CM | POA: Diagnosis not present

## 2018-11-12 DIAGNOSIS — Z79899 Other long term (current) drug therapy: Secondary | ICD-10-CM | POA: Diagnosis not present

## 2018-12-22 DIAGNOSIS — G894 Chronic pain syndrome: Secondary | ICD-10-CM | POA: Diagnosis not present

## 2019-03-10 IMAGING — CT CT ABD-PELV W/ CM
1 of 3 series · 14 of 32 positions shown, 19 images · IV contrast (APPLIED)
Comparison: None.

CLINICAL DATA: Left upper quadrant abdominal pain. Diarrhea. Prior
appendectomy.

EXAM:
CT ABDOMEN AND PELVIS WITH CONTRAST
TECHNIQUE: Multidetector CT imaging of the abdomen and pelvis was performed
using the standard protocol following bolus administration of
intravenous contrast.
CONTRAST:  125mL [id number] IOPAMIDOL ([id number]) INJECTION 61%

[Series 2: abd/pelvis w/cm · axial · 0.92mm/px · z∈[-508,-78]mm · 14 of 98 slices shown, 19 images]
[im 6/98  soft-tissue]
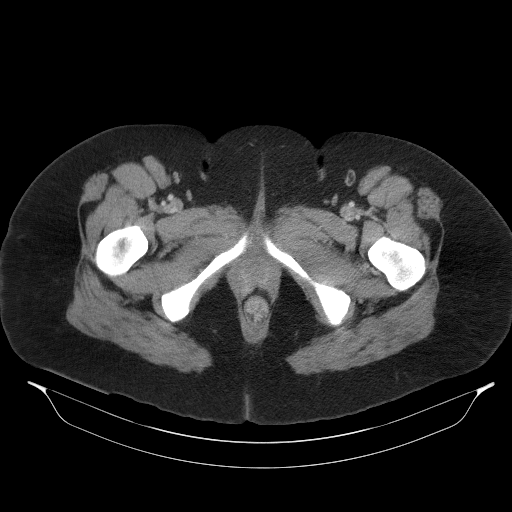
[im 6/98  bone]
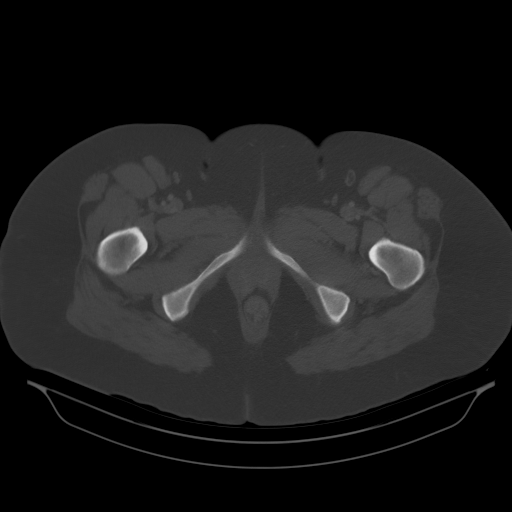
[im 11/98  soft-tissue]
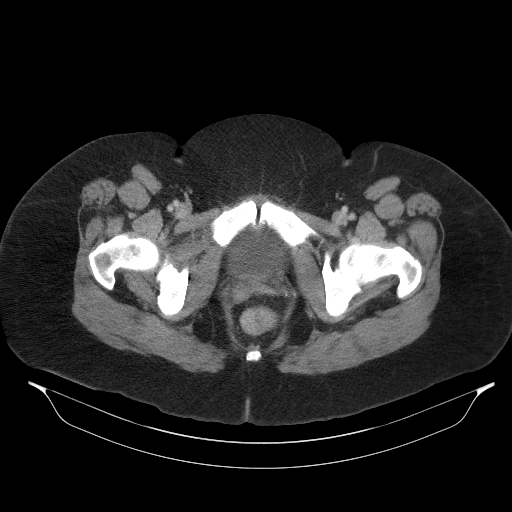
[im 22/98  soft-tissue]
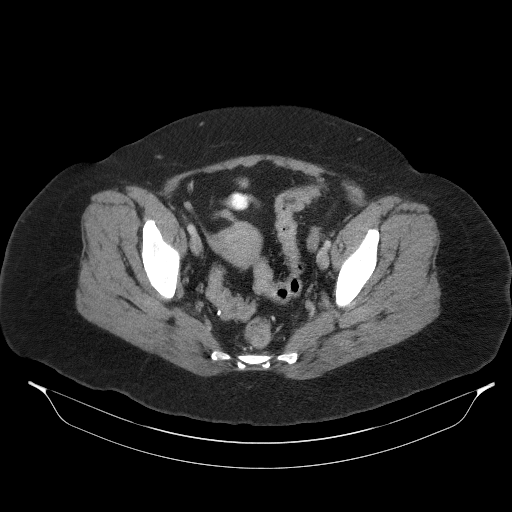
[im 27/98  soft-tissue]
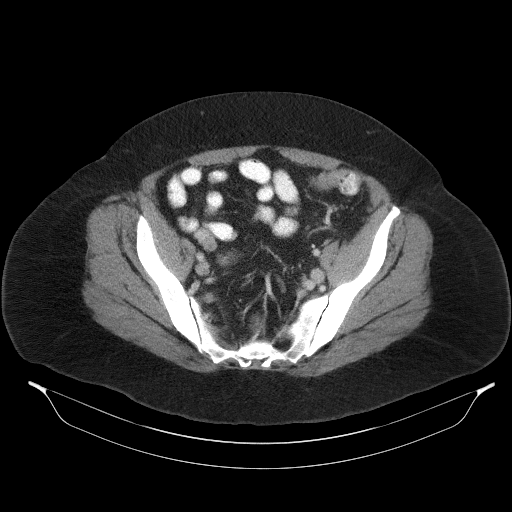
[im 33/98  soft-tissue]
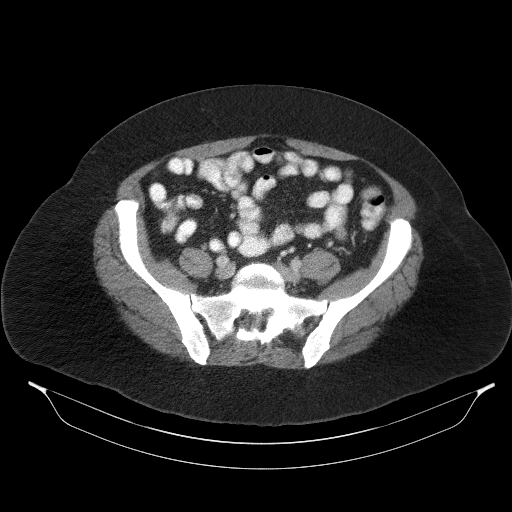
[im 44/98  soft-tissue]
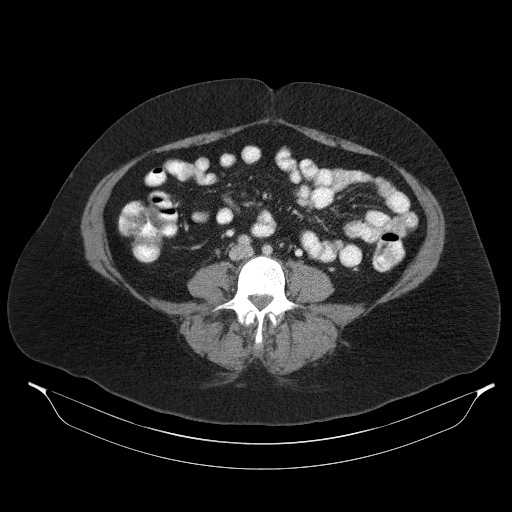
[im 49/98  soft-tissue]
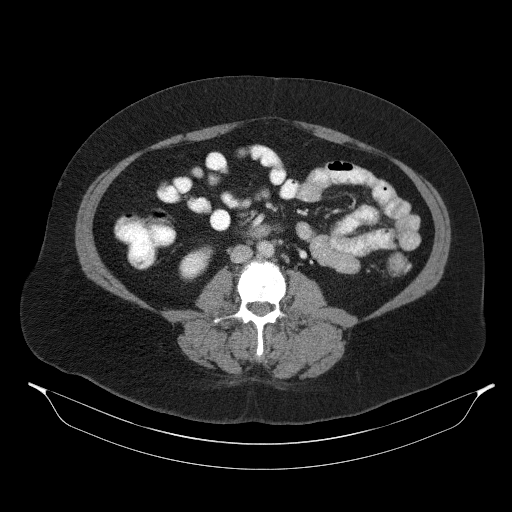
[im 54/98  soft-tissue]
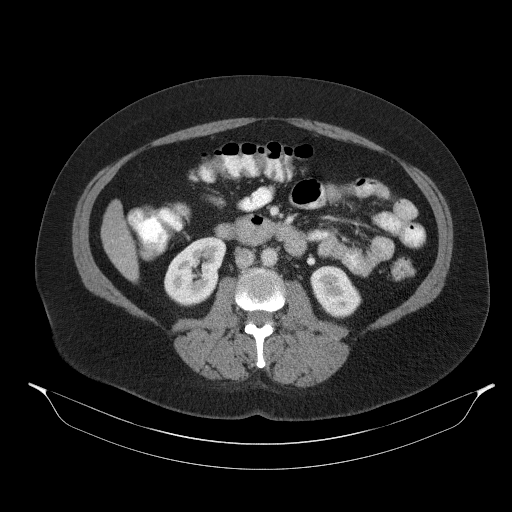
[im 65/98  soft-tissue]
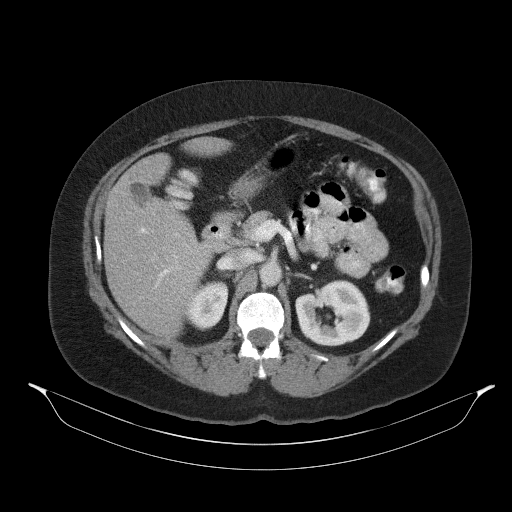
[im 65/98  bone]
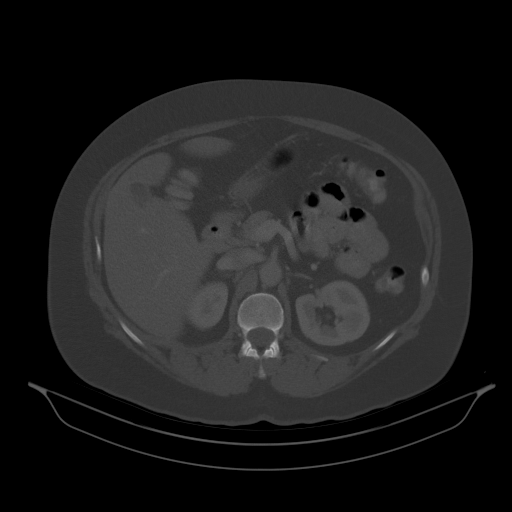
[im 71/98  soft-tissue]
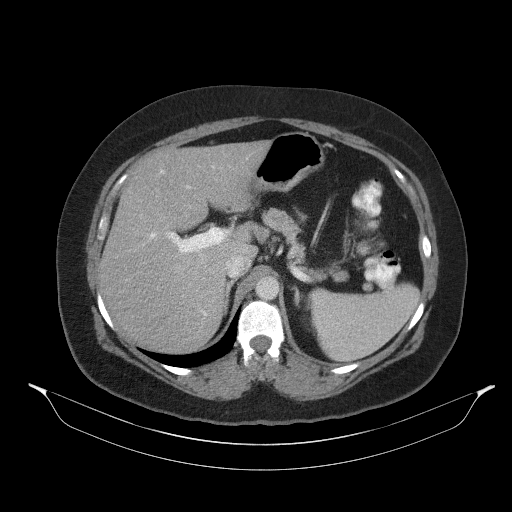
[im 76/98  soft-tissue]
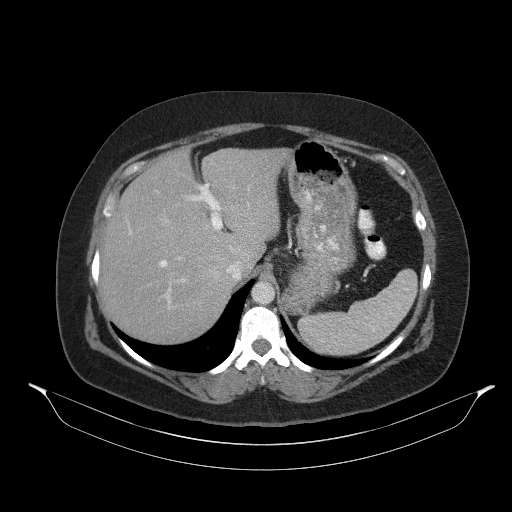
[im 76/98  lung]
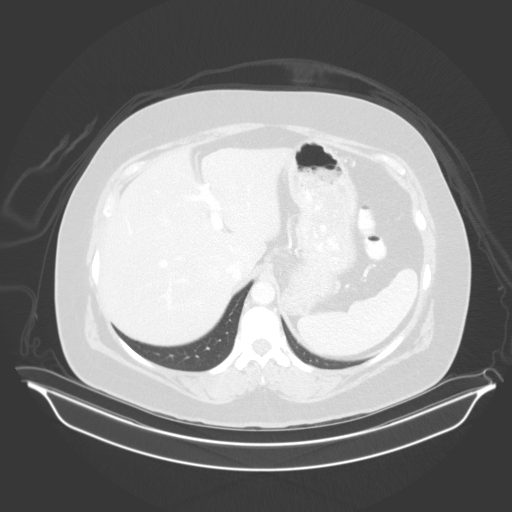
[im 81/98  lung]
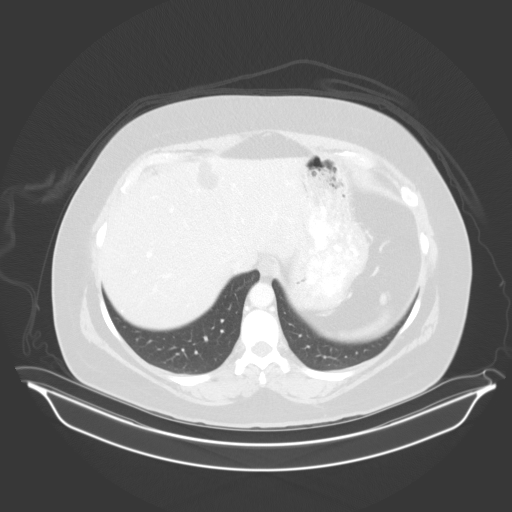
[im 87/98  soft-tissue]
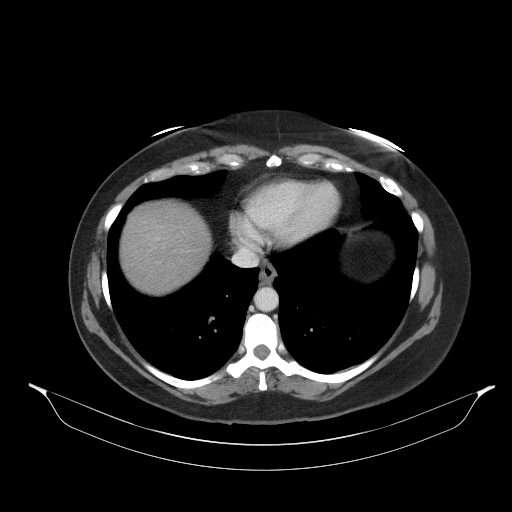
[im 87/98  lung]
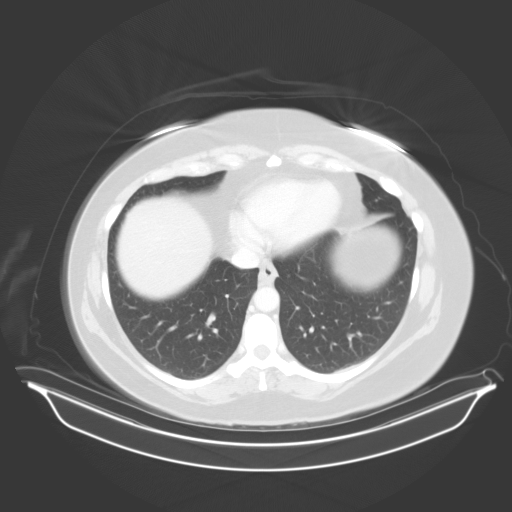
[im 92/98  soft-tissue]
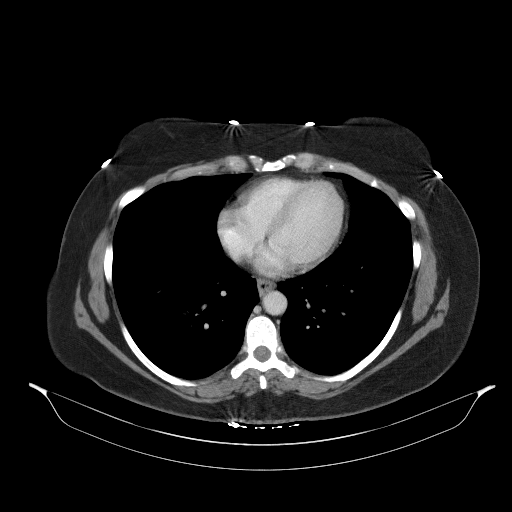
[im 92/98  lung]
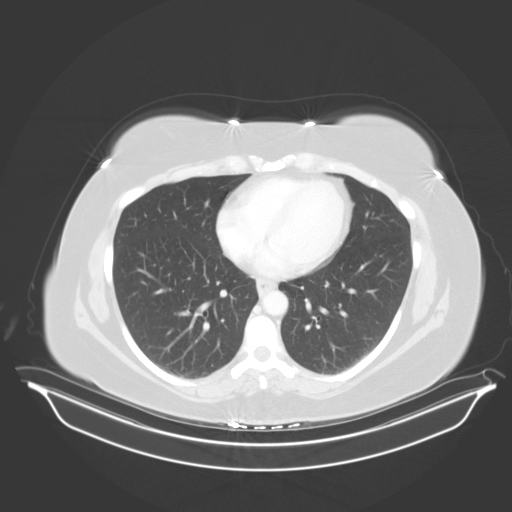

[14 of 32 positions shown; findings below may reference images not displayed]

FINDINGS: Lower chest: Normal heart size. Dependent atelectasis within the
bilateral lower lobes. No pleural effusion.

Hepatobiliary: There is a 2.8 cm cyst within the left hepatic lobe
and a 1.9 cm cyst within the right hepatic lobe. Gallbladder is
unremarkable. No intrahepatic or extrahepatic biliary ductal
dilatation.

Pancreas: Unremarkable

Spleen: Unremarkable

Adrenals/Urinary Tract: Normal adrenal glands. Kidneys enhance
symmetrically with contrast. Urinary bladder is unremarkable.

Stomach/Bowel: Postsurgical changes compatible with prior
appendectomy. The sigmoid colon is decompressed. There is mild
mesenteric hyperemia about the sigmoid colon. Normal morphology of
the stomach. No evidence for small bowel obstruction. No free fluid
or free intraperitoneal air.

Vascular/Lymphatic: Normal caliber abdominal aorta. Peripheral
calcified atherosclerotic plaque. No retroperitoneal
lymphadenopathy.

Reproductive: Uterus and adnexal structures are unremarkable.

Other: None.

Musculoskeletal: Lumbar spine degenerative changes. No aggressive or
acute appearing osseous lesions.
IMPRESSION: 1. Sigmoid colon is decompressed, limiting evaluation, however there
is mild mesenteric hyperemia about the sigmoid colon which may
represent sequelae of prior colitis or potentially mild acute
colitis. Recommend clinical correlation.
2. No additional acute findings within the abdomen or pelvis.

## 2019-07-11 IMAGING — MR MR SHOULDER*L* W/O CM
4 of 6 series · 14 of 40 positions shown · non-contrast
Comparison: 02/18/2018

CLINICAL DATA: Acute left shoulder pain. Left shoulder and upper
arm pain since late [REDACTED]/ early September 2017. Pain is medial,
running length of humerus. No known injury, no injections, no mass,
no history of cancer.

EXAM:
MRI OF THE LEFT SHOULDER WITHOUT CONTRAST
MRI OF THE LEFT HUMERUS WITHOUT CONTRAST
TECHNIQUE: Multiplanar, multisequence MR imaging of the shoulder was performed.
No intravenous contrast was administered.
Multiplanar, multisequence MR imaging of the left humerus was
performed. No intravenous contrast was administered.

[Series 6: T2 fat-sat · axial · left · 3.0mm · 0.44mm/px · z∈[-44,+15]mm · 3 of 25 slices shown (1 of 3)]
[im 5/25]
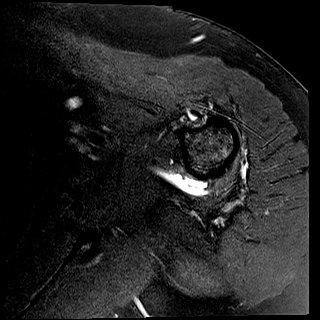
[im 13/25]
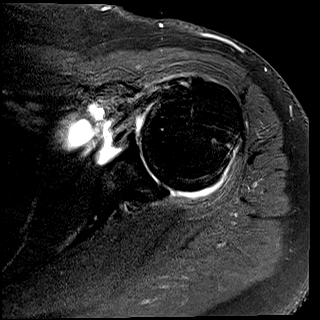
[im 21/25]
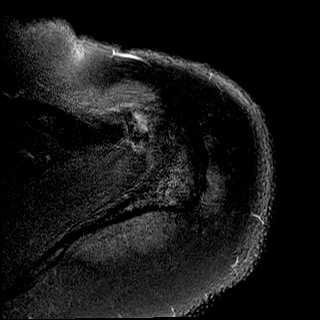

[Series 7: T2 fat-sat · oblique · left · 3.0mm · 0.44mm/px · 3 of 23 slices shown (2 of 3)]
[im 4/23]
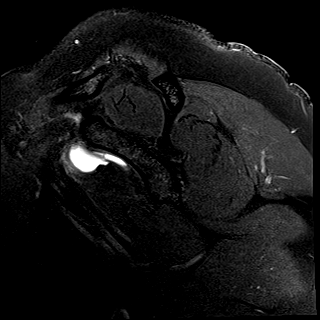
[im 12/23]
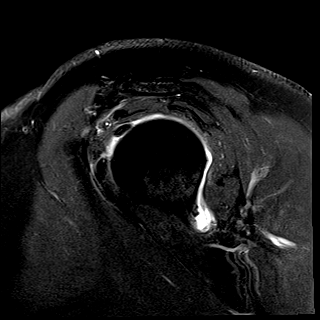
[im 19/23]
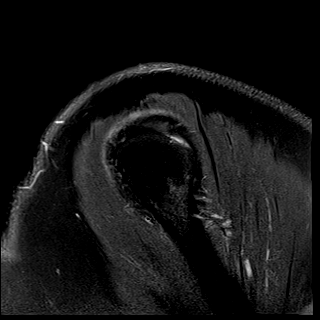

[Series 9: PD · oblique · left · 3.0mm · 0.18mm/px · 5 of 21 slices shown]
[im 1/21]
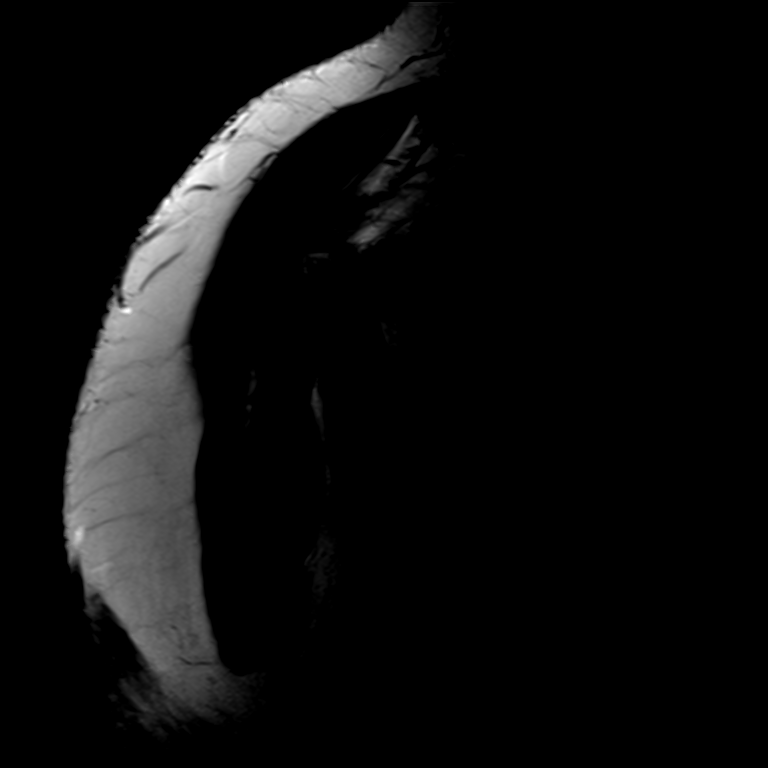
[im 5/21]
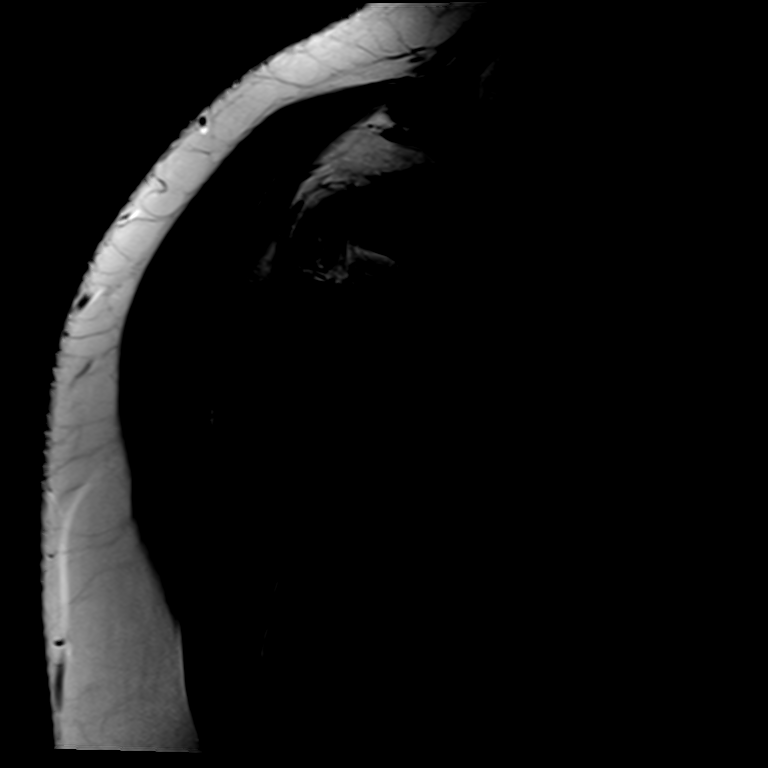
[im 9/21]
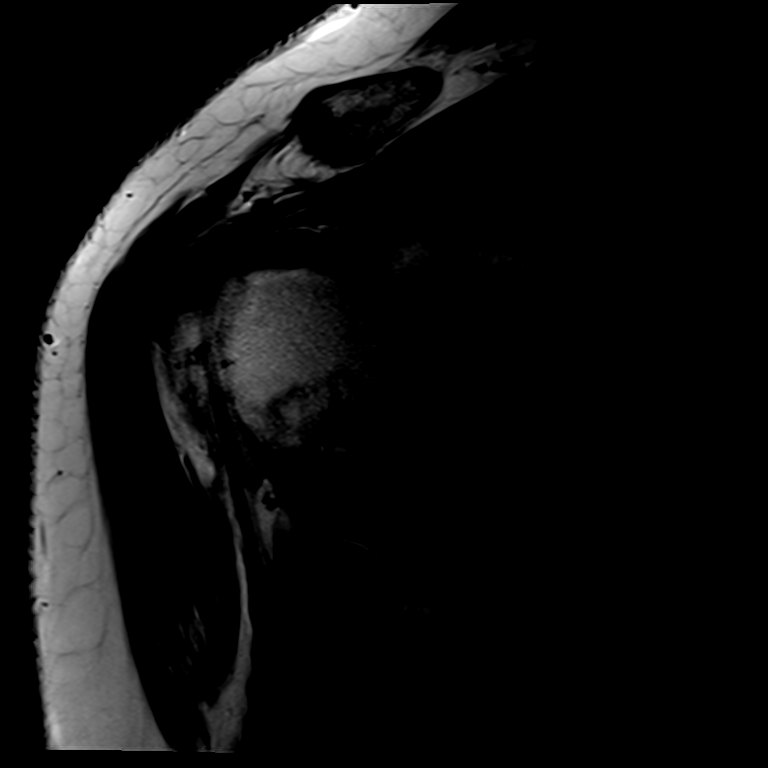
[im 13/21]
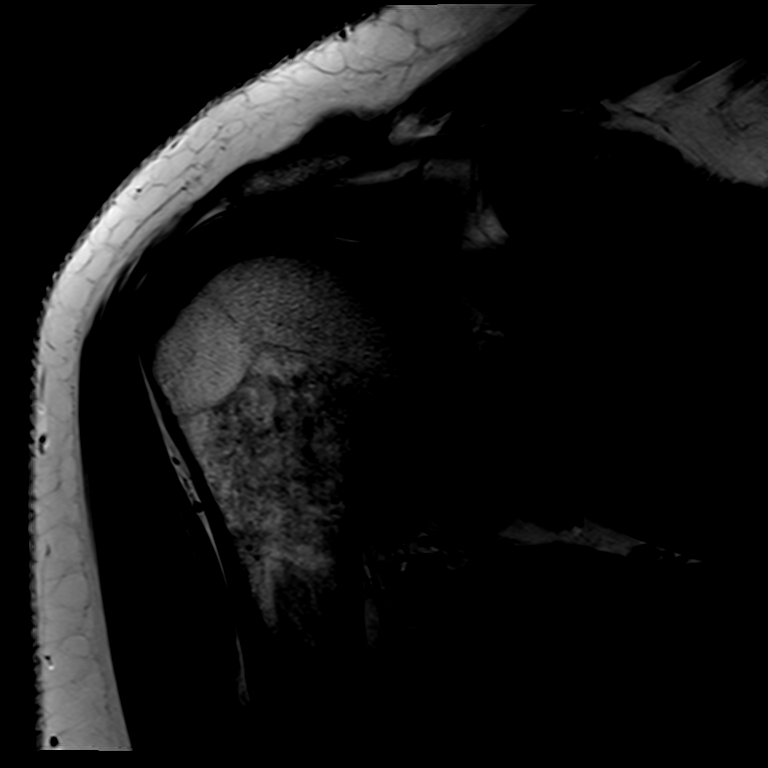
[im 21/21]
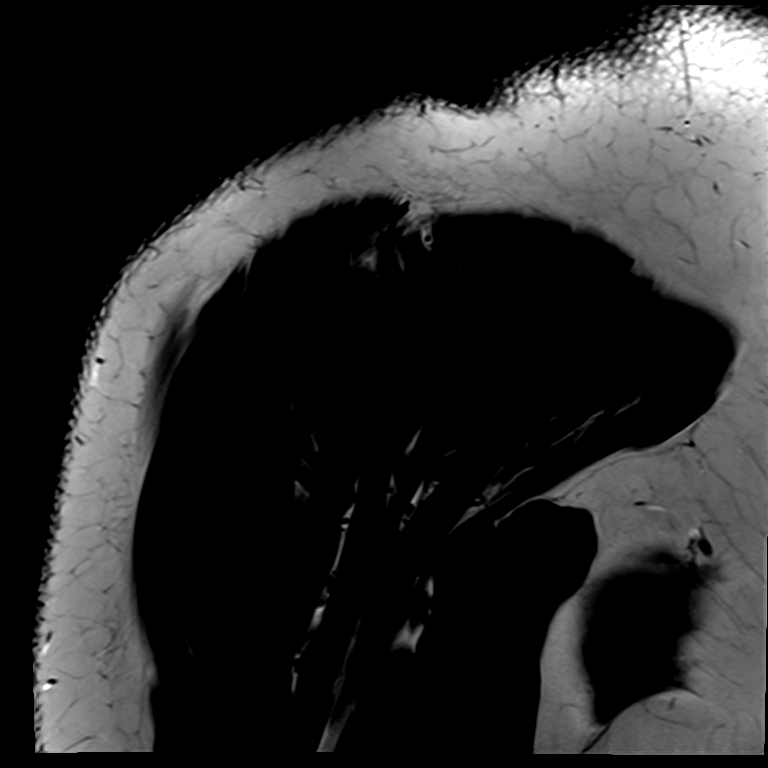

[Series 10: T2 fat-sat · oblique · left · 3.0mm · 0.22mm/px · 3 of 21 slices shown (3 of 3)]
[im 5/21]
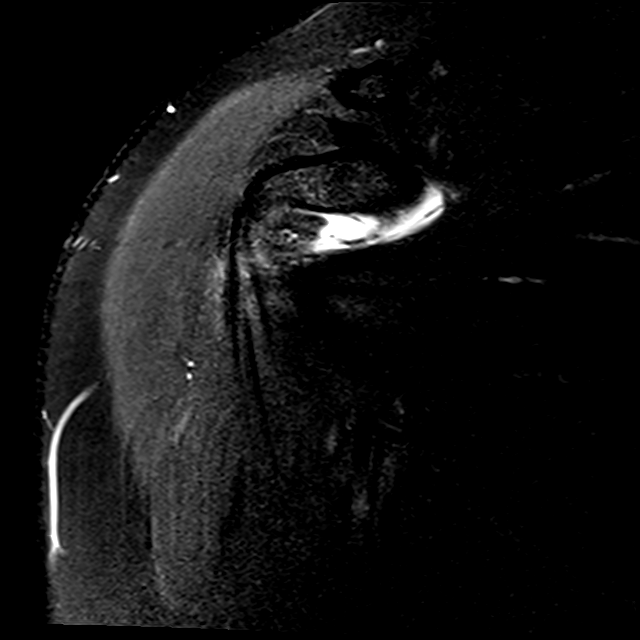
[im 13/21]
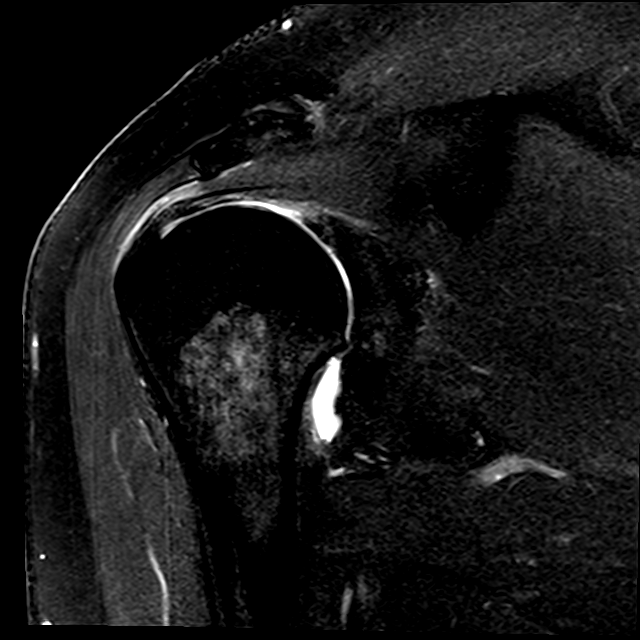
[im 21/21]
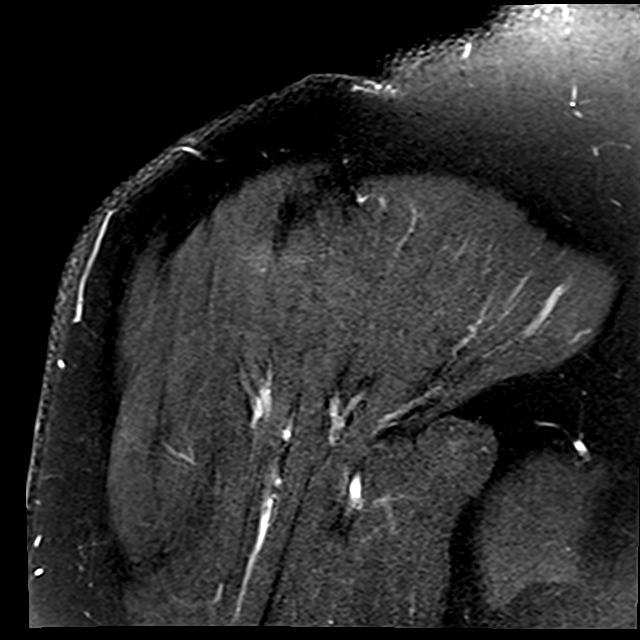

[14 of 40 positions shown; findings below may reference images not displayed]

FINDINGS: Rotator cuff: Severe tendinosis of the supraspinatus tendon. Mild
tendinosis of the infraspinatus tendon. Teres minor tendon is
intact. Subscapularis tendon is intact.

Muscles: No atrophy or fatty replacement of nor abnormal signal
within, the muscles of the rotator cuff. Flexor, extensor and
deltoid musculature around the shoulder and upper arm demonstrates
no focal abnormality. No muscle edema.

Biceps long head: Mild tendinosis of the intra-articular portion of
the long head of the biceps tendon. The remainder of the
extra-articular portion of the biceps tendon is normal.

Acromioclavicular Joint: Mild arthropathy of the acromioclavicular
joint. Type IV acromion. Small amount of subacromial/subdeltoid
bursal fluid.

Glenohumeral Joint: Small joint effusion. Partial-thickness
cartilage loss of the glenohumeral joint with small marginal
osteophytes.

Labrum:  Superior labral tear from anterior to posterior.

Bones: No acute osseous abnormality. No aggressive osseous lesion.
Mild subcortical reactive marrow changes at the infraspinatus
insertion. No periosteal reaction or bone destruction.

Other: No fluid collection or hematoma. No soft tissue mass. No
areas of abnormal enhancement. Normal neurovascular bundles.
Proximal aspect of the cubital tunnel is normal.
IMPRESSION: 1. Severe tendinosis of the supraspinatus tendon.
2. Mild tendinosis of the infraspinatus tendon.
3. Superior labral tear from anterior to posterior.
4. Mild tendinosis of the intra-articular portion of the long head
of the biceps tendon.
5. No focal abnormality of the left humerus.

## 2019-07-11 IMAGING — MR MR HUMERUS*L* W/O CM
8 series · 40 of 40 positions shown · non-contrast
Comparison: 02/18/2018

CLINICAL DATA: Acute left shoulder pain. Left shoulder and upper
arm pain since late [REDACTED]/ early September 2017. Pain is medial,
running length of humerus. No known injury, no injections, no mass,
no history of cancer.

EXAM:
MRI OF THE LEFT SHOULDER WITHOUT CONTRAST
MRI OF THE LEFT HUMERUS WITHOUT CONTRAST
TECHNIQUE: Multiplanar, multisequence MR imaging of the shoulder was performed.
No intravenous contrast was administered.
Multiplanar, multisequence MR imaging of the left humerus was
performed. No intravenous contrast was administered.

[Series 16: T2 fat-sat · axial · left · 5.0mm · 0.62mm/px · z∈[-319,-98]mm · 6 of 39 slices shown (1 of 3)]
[im 1/39]
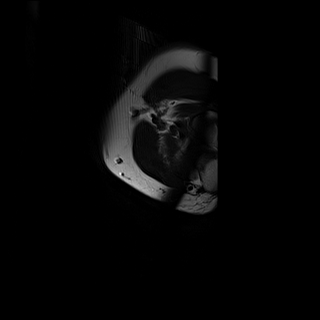
[im 8/39]
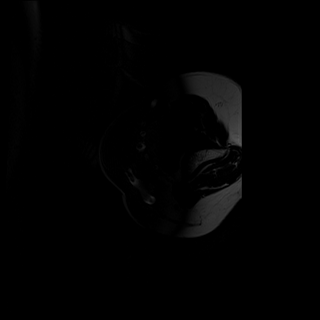
[im 16/39]
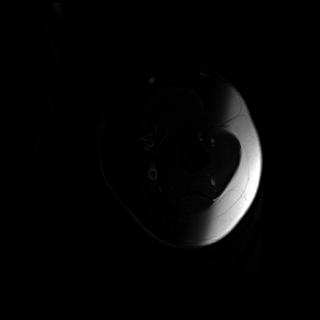
[im 23/39]
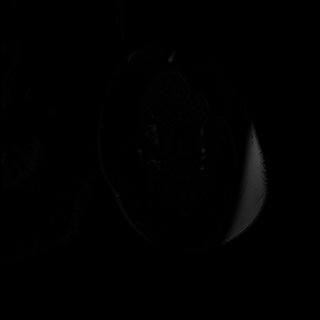
[im 31/39]
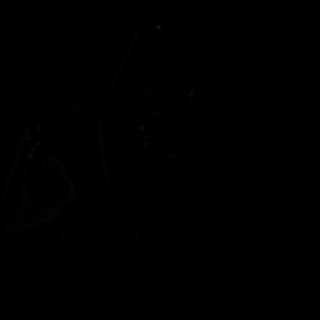
[im 39/39]
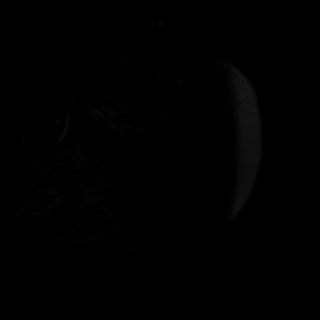

[Series 17: T1 fat-sat · axial · left · 5.0mm · 0.62mm/px · z∈[-319,-98]mm · 6 of 39 slices shown]
[im 1/39]
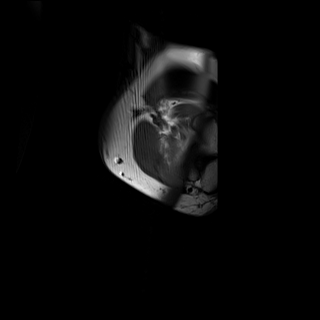
[im 8/39]
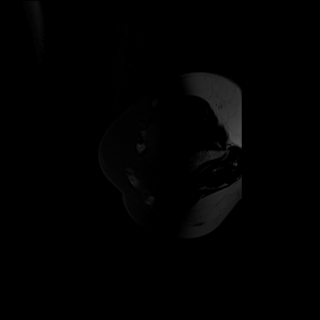
[im 16/39]
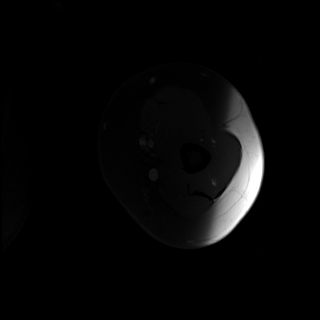
[im 23/39]
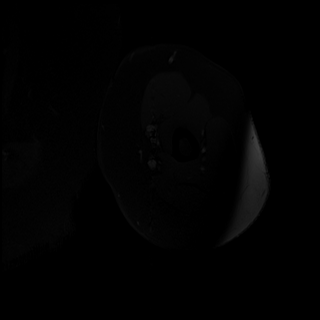
[im 31/39]
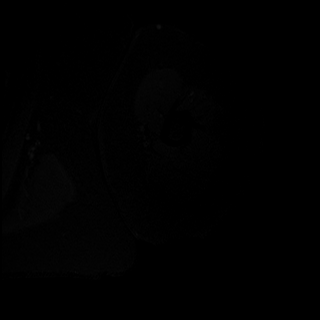
[im 39/39]
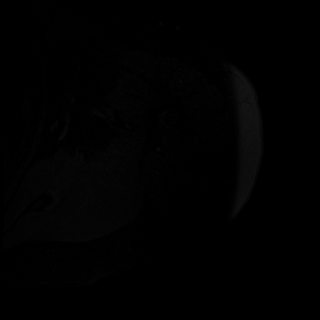

[Series 18: T1 · axial · left · 5.0mm · 0.62mm/px · z∈[-319,-98]mm · 6 of 39 slices shown (1 of 2)]
[im 1/39]
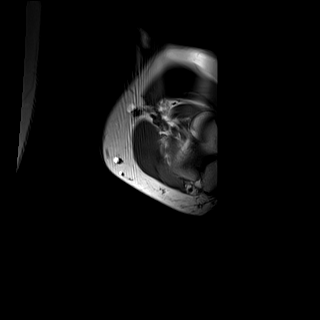
[im 8/39]
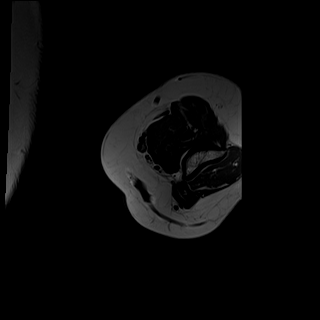
[im 16/39]
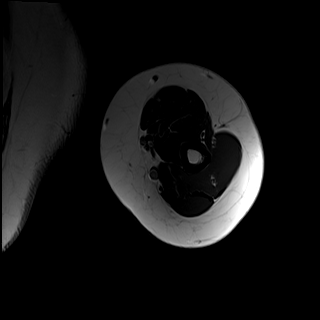
[im 23/39]
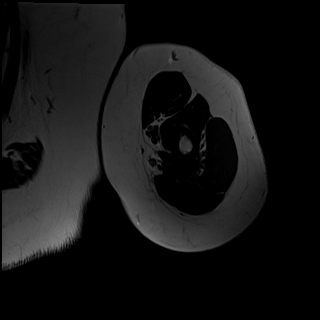
[im 31/39]
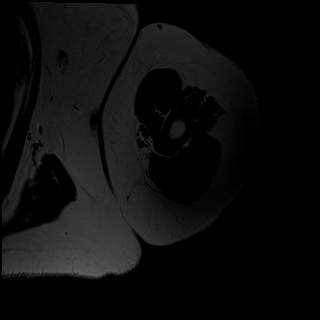
[im 39/39]
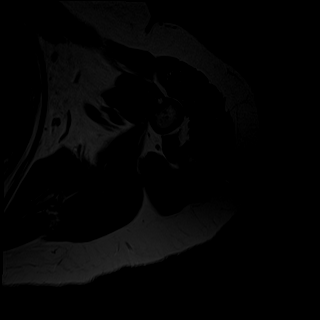

[Series 19: T1 · coronal · left · 3.5mm · 0.88mm/px · 5 of 28 slices shown (2 of 2)]
[im 1/28]
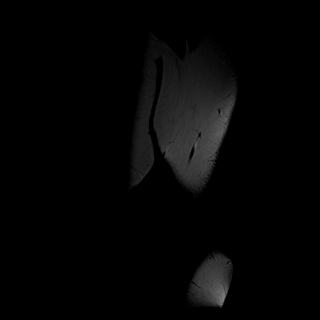
[im 7/28]
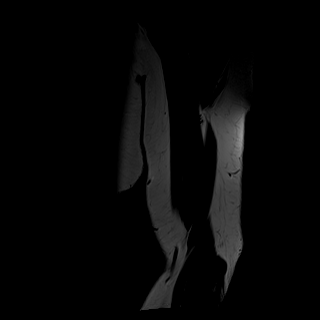
[im 14/28]
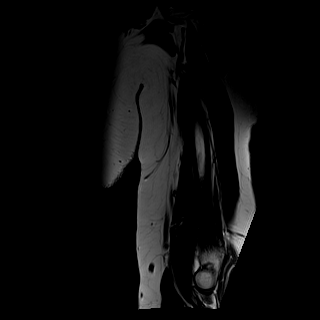
[im 21/28]
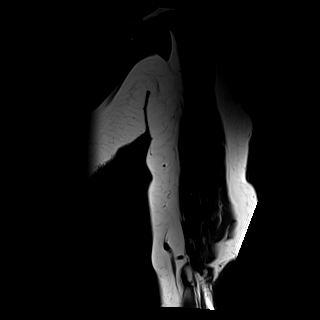
[im 28/28]
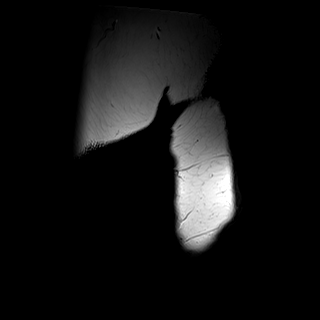

[Series 20: T2 fat-sat · coronal · left · 3.5mm · 0.81mm/px · 5 of 28 slices shown (2 of 3)]
[im 1/28]
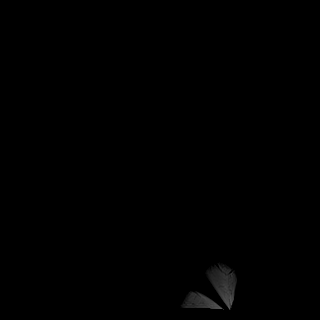
[im 7/28]
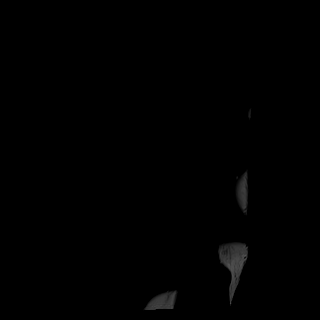
[im 14/28]
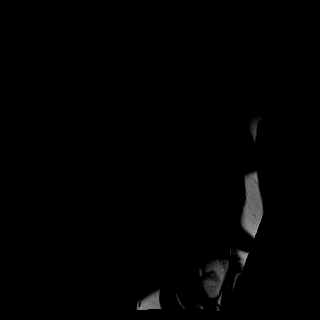
[im 21/28]
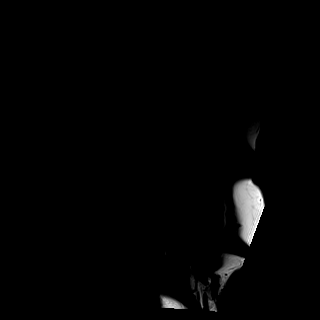
[im 28/28]
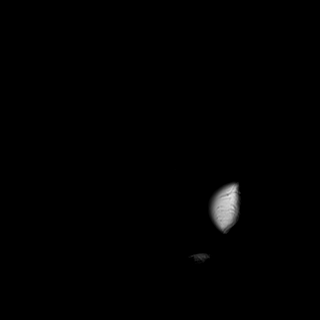

[Series 21: T2 fat-sat · sagittal · left · 3.0mm · 0.68mm/px · 3 of 21 slices shown (3 of 3)]
[im 1/21]
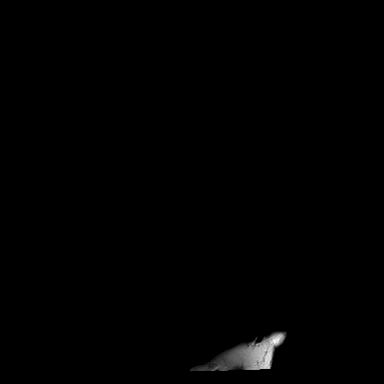
[im 11/21]
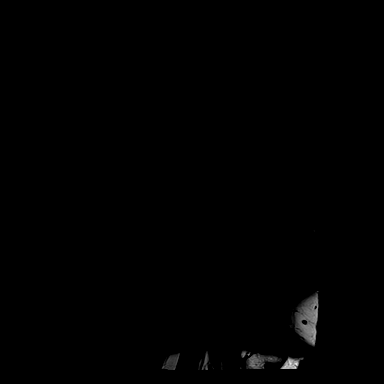
[im 21/21]
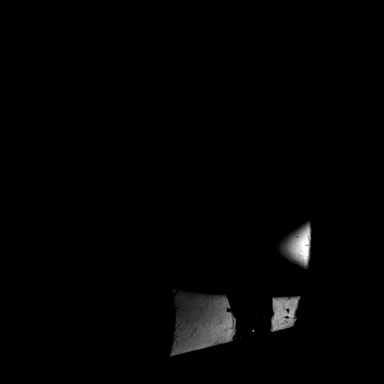

[Series 22: STIR · coronal · left · 3.5mm · 0.91mm/px · 5 of 28 slices shown (1 of 2)]
[im 1/28]
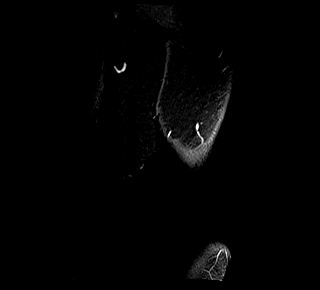
[im 7/28]
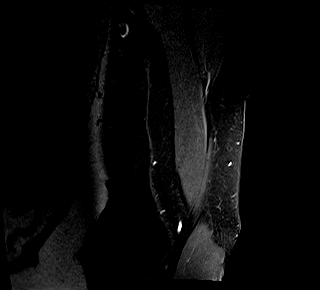
[im 14/28]
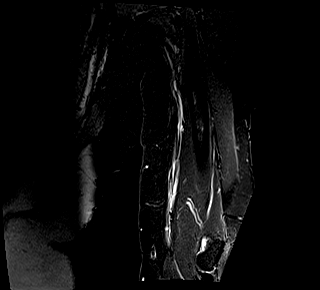
[im 21/28]
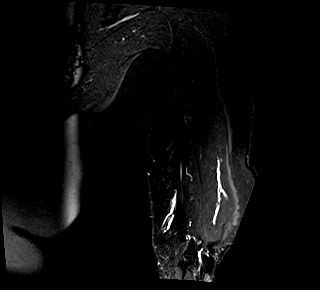
[im 28/28]
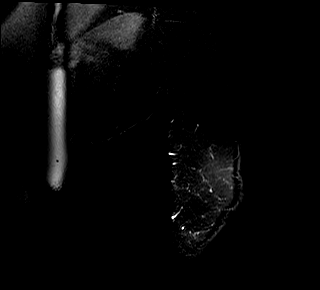

[Series 23: STIR · sagittal · left · 3.0mm · 0.91mm/px · 4 of 26 slices shown (2 of 2)]
[im 1/26]
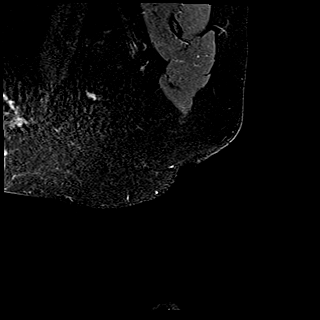
[im 9/26]
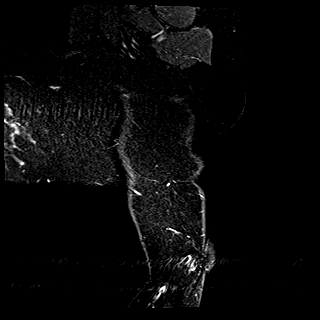
[im 17/26]
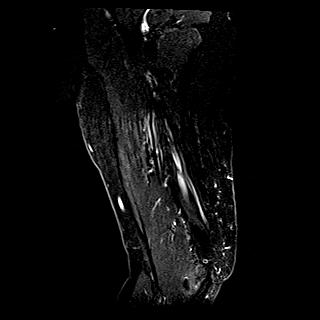
[im 26/26]
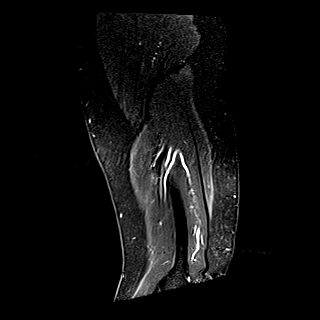

[40 of 40 positions shown; findings below may reference images not displayed]

FINDINGS: Rotator cuff: Severe tendinosis of the supraspinatus tendon. Mild
tendinosis of the infraspinatus tendon. Teres minor tendon is
intact. Subscapularis tendon is intact.

Muscles: No atrophy or fatty replacement of nor abnormal signal
within, the muscles of the rotator cuff. Flexor, extensor and
deltoid musculature around the shoulder and upper arm demonstrates
no focal abnormality. No muscle edema.

Biceps long head: Mild tendinosis of the intra-articular portion of
the long head of the biceps tendon. The remainder of the
extra-articular portion of the biceps tendon is normal.

Acromioclavicular Joint: Mild arthropathy of the acromioclavicular
joint. Type IV acromion. Small amount of subacromial/subdeltoid
bursal fluid.

Glenohumeral Joint: Small joint effusion. Partial-thickness
cartilage loss of the glenohumeral joint with small marginal
osteophytes.

Labrum:  Superior labral tear from anterior to posterior.

Bones: No acute osseous abnormality. No aggressive osseous lesion.
Mild subcortical reactive marrow changes at the infraspinatus
insertion. No periosteal reaction or bone destruction.

Other: No fluid collection or hematoma. No soft tissue mass. No
areas of abnormal enhancement. Normal neurovascular bundles.
Proximal aspect of the cubital tunnel is normal.
IMPRESSION: 1. Severe tendinosis of the supraspinatus tendon.
2. Mild tendinosis of the infraspinatus tendon.
3. Superior labral tear from anterior to posterior.
4. Mild tendinosis of the intra-articular portion of the long head
of the biceps tendon.
5. No focal abnormality of the left humerus.

## 2019-11-02 ENCOUNTER — Other Ambulatory Visit: Payer: Self-pay

## 2019-11-02 DIAGNOSIS — Z20822 Contact with and (suspected) exposure to covid-19: Secondary | ICD-10-CM

## 2019-11-03 LAB — NOVEL CORONAVIRUS, NAA: SARS-CoV-2, NAA: NOT DETECTED

## 2019-12-15 ENCOUNTER — Ambulatory Visit: Payer: 59 | Attending: Internal Medicine

## 2019-12-15 DIAGNOSIS — Z20822 Contact with and (suspected) exposure to covid-19: Secondary | ICD-10-CM

## 2019-12-17 LAB — NOVEL CORONAVIRUS, NAA: SARS-CoV-2, NAA: NOT DETECTED

## 2022-07-17 ENCOUNTER — Other Ambulatory Visit: Payer: Self-pay | Admitting: Family Medicine

## 2022-07-17 DIAGNOSIS — M545 Low back pain, unspecified: Secondary | ICD-10-CM

## 2022-07-24 ENCOUNTER — Ambulatory Visit
Admission: RE | Admit: 2022-07-24 | Discharge: 2022-07-24 | Disposition: A | Discharge status: Home / Self Care | Payer: 59 | Source: Ambulatory Visit | Attending: Family Medicine | Admitting: Family Medicine

## 2022-07-24 DIAGNOSIS — M545 Low back pain, unspecified: Secondary | ICD-10-CM

## 2022-07-24 MED ORDER — DIAZEPAM 5 MG PO TABS
10.0000 mg | ORAL_TABLET | Freq: Once | ORAL | Status: AC
  Administered 2022-07-24: 10 mg via ORAL

## 2022-07-24 MED ORDER — IOPAMIDOL (ISOVUE-M 200) INJECTION 41%
20.0000 mL | Freq: Once | INTRAMUSCULAR | Status: AC
  Administered 2022-07-24: 20 mL via INTRATHECAL

## 2022-07-24 MED ORDER — MEPERIDINE HCL 50 MG/ML IJ SOLN: 50.0000 mg | Freq: Once | INTRAMUSCULAR | Status: DC | PRN

## 2022-07-24 MED ORDER — ONDANSETRON HCL 4 MG/2ML IJ SOLN: 4.0000 mg | Freq: Once | INTRAMUSCULAR | Status: DC | PRN

## 2022-07-24 NOTE — Progress Notes (Signed)
Pt reports her Spinal cord stimulator has been dead for years, for her myelogram procedure.

## 2022-07-24 NOTE — Discharge Instructions (Signed)

## 2022-08-23 ENCOUNTER — Other Ambulatory Visit: Payer: Self-pay | Admitting: Orthopedic Surgery

## 2022-08-23 DIAGNOSIS — M545 Other chronic pain: Secondary | ICD-10-CM

## 2022-08-29 ENCOUNTER — Ambulatory Visit
Admission: RE | Admit: 2022-08-29 | Discharge: 2022-08-29 | Disposition: A | Discharge status: Home / Self Care | Payer: 59 | Source: Ambulatory Visit | Attending: Orthopedic Surgery | Admitting: Orthopedic Surgery

## 2022-08-29 DIAGNOSIS — M545 Low back pain, unspecified: Secondary | ICD-10-CM

## 2022-08-29 MED ORDER — IOPAMIDOL (ISOVUE-M 200) INJECTION 41%
1.0000 mL | Freq: Once | INTRAMUSCULAR | Status: AC
  Administered 2022-08-29: 1 mL via EPIDURAL

## 2022-08-29 MED ORDER — METHYLPREDNISOLONE ACETATE 40 MG/ML INJ SUSP (RADIOLOG
80.0000 mg | Freq: Once | INTRAMUSCULAR | Status: AC
  Administered 2022-08-29: 80 mg via EPIDURAL

## 2022-08-29 NOTE — Discharge Instructions (Signed)

## 2022-09-21 ENCOUNTER — Other Ambulatory Visit: Payer: Self-pay | Admitting: Orthopedic Surgery

## 2022-10-15 NOTE — Progress Notes (Signed)
Surgical Instructions    Your procedure is scheduled on Thursday November 9th.  Report to [facility] Main Entrance "A" at 5:30 A.M., then check in with the Admitting office.  Call this number if you have problems the morning of surgery:  [phone]   If you have any questions prior to your surgery date call [phone]: Open Monday-Friday 8am-4pm If you experience any cold or flu symptoms such as cough, fever, chills, shortness of breath, etc. between now and your scheduled surgery, please notify us at the above number     Remember:  Do not eat after midnight the night before your surgery  You may drink clear liquids until 4:30am the morning of your surgery.   Clear liquids allowed are: Water, Non-Citrus Juices (without pulp), Carbonated Beverages, Clear Tea, Black Coffee ONLY (NO MILK, CREAM OR POWDERED CREAMER of any kind), and Gatorade   Enhanced Recovery after Surgery for Orthopedics Enhanced Recovery after Surgery is a protocol used to improve the stress on your body and your recovery after surgery.  Patient Instructions  The day of surgery (if you do NOT have diabetes):  Drink ONE (1) Pre-Surgery Clear Ensure by _4:30____ am the morning of surgery   This drink was given to you during your hospital  pre-op appointment visit. Nothing else to drink after completing the  Pre-Surgery Clear Ensure.          If you have questions, please contact your surgeon's office.    Take these medicines the morning of surgery with A SIP OF WATER: budesonide (ENTOCORT EC) 3 MG 24 hr capsule  pantoprazole (PROTONIX) 40 MG tablet   IF NEEDED  HYDROcodone-acetaminophen (NORCO) 10-325 MG tablet    Bring your inhalers with you to the hospital   As of today, STOP taking any Aspirin (unless otherwise instructed by your surgeon) Aleve, Naproxen, Ibuprofen, Motrin, Advil, Goody's, BC's, all herbal medications, fish oil, and all vitamins.           Do not wear jewelry or makeup. Do not  wear lotions, powders, perfumes or deodorant. Do not shave 48 hours prior to surgery.   Do not bring valuables to the hospital. Do not wear nail polish, gel polish, artificial nails, or any other type of covering on natural nails (fingers and toes) If you have artificial nails or gel coating that need to be removed by a nail salon, please have this removed prior to surgery. Artificial nails or gel coating may interfere with anesthesia's ability to adequately monitor your vital signs.  [facility] is not responsible for any belongings or valuables.    Do NOT Smoke (Tobacco/Vaping)  24 hours prior to your procedure  If you use a CPAP at night, you may bring your mask for your overnight stay.   Contacts, glasses, hearing aids, dentures or partials may not be worn into surgery, please bring cases for these belongings   For patients admitted to the hospital, discharge time will be determined by your treatment team.   Patients discharged the day of surgery will not be allowed to drive home, and someone needs to stay with them for 24 hours.   SURGICAL WAITING ROOM VISITATION Patients having surgery or a procedure may have no more than 2 support people in the waiting area - these visitors may rotate.   Children under the age of 45 must have an adult with them who is not the patient. If the patient needs to stay at the hospital during part of their recovery,  the visitor guidelines for inpatient rooms apply. Pre-op nurse will coordinate an appropriate time for 1 support person to accompany patient in pre-op.  This support person may not rotate.   Please refer to [email or web address] for the visitor guidelines for Inpatients (after your surgery is over and you are in a regular room).    Special instructions:    Oral Hygiene is also important to reduce your risk of infection.  Remember - BRUSH YOUR TEETH THE MORNING OF SURGERY WITH YOUR REGULAR  TOOTHPASTE   [place]- Preparing For Surgery  Before surgery, you can play an important role. Because skin is not sterile, your skin needs to be as free of germs as possible. You can reduce the number of germs on your skin by washing with CHG (chlorahexidine gluconate) Soap before surgery.  CHG is an antiseptic cleaner which kills germs and bonds with the skin to continue killing germs even after washing.     Please do not use if you have an allergy to CHG or antibacterial soaps. If your skin becomes reddened/irritated stop using the CHG.  Do not shave (including legs and underarms) for at least 48 hours prior to first CHG shower. It is OK to shave your face.  Please follow these instructions carefully.     Shower the NIGHT BEFORE SURGERY and the MORNING OF SURGERY with CHG Soap.   If you chose to wash your hair, wash your hair first as usual with your normal shampoo. After you shampoo, rinse your hair and body thoroughly to remove the shampoo.  Then [employer] and genitals (private parts) with your normal soap and rinse thoroughly to remove soap.  After that Use CHG Soap as you would any other liquid soap. You can apply CHG directly to the skin and wash gently with a scrungie or a clean washcloth.   Apply the CHG Soap to your body ONLY FROM THE NECK DOWN.  Do not use on open wounds or open sores. Avoid contact with your eyes, ears, mouth and genitals (private parts). Wash Face and genitals (private parts)  with your normal soap.   Wash thoroughly, paying special attention to the area where your surgery will be performed.  Thoroughly rinse your body with warm water from the neck down.  DO NOT shower/wash with your normal soap after using and rinsing off the CHG Soap.  Pat yourself dry with a CLEAN TOWEL.  Wear CLEAN PAJAMAS to bed the night before surgery  Place CLEAN SHEETS on your bed the night before your surgery  DO NOT SLEEP WITH PETS.   Day of Surgery:  Take a shower  with CHG soap. Wear Clean/Comfortable clothing the morning of surgery Do not apply any deodorants/lotions.   Remember to brush your teeth WITH YOUR REGULAR TOOTHPASTE.    If you received a COVID test during your pre-op visit, it is requested that you wear a mask when out in public, stay away from anyone that may not be feeling well, and notify your surgeon if you develop symptoms. If you have been in contact with anyone that has tested positive in the last 10 days, please notify your surgeon.    Please read over the following fact sheets that you were given.

## 2022-10-16 ENCOUNTER — Encounter (HOSPITAL_COMMUNITY)
Admission: RE | Admit: 2022-10-16 | Discharge: 2022-10-16 | Disposition: A | Discharge status: Home / Self Care | Payer: 59 | Source: Ambulatory Visit | Attending: Orthopedic Surgery | Admitting: Orthopedic Surgery

## 2022-10-16 ENCOUNTER — Encounter (HOSPITAL_COMMUNITY): Payer: Self-pay

## 2022-10-16 ENCOUNTER — Other Ambulatory Visit: Payer: Self-pay

## 2022-10-16 VITALS — BP 116/75 | HR 104 | Temp 98.1°F | Resp 18 | Ht 65.0 in | Wt 213.2 lb

## 2022-10-16 DIAGNOSIS — Z01812 Encounter for preprocedural laboratory examination: Secondary | ICD-10-CM | POA: Insufficient documentation

## 2022-10-16 DIAGNOSIS — Z01818 Encounter for other preprocedural examination: Secondary | ICD-10-CM

## 2022-10-16 HISTORY — DX: Attention-deficit hyperactivity disorder, unspecified type: F90.9

## 2022-10-16 LAB — TYPE AND SCREEN
ABO/RH(D): A POS
Antibody Screen: NEGATIVE

## 2022-10-16 LAB — CBC
HCT: 39.8 % (ref 36.0–46.0)
Hemoglobin: 12.9 g/dL (ref 12.0–15.0)
MCH: 28.6 pg (ref 26.0–34.0)
MCHC: 32.4 g/dL (ref 30.0–36.0)
MCV: 88.2 fL (ref 80.0–100.0)
Platelets: 488 10*3/uL — ABNORMAL HIGH (ref 150–400)
RBC: 4.51 MIL/uL (ref 3.87–5.11)
RDW: 15.6 % — ABNORMAL HIGH (ref 11.5–15.5)
WBC: 11.8 10*3/uL — ABNORMAL HIGH (ref 4.0–10.5)
nRBC: 0 % (ref 0.0–0.2)

## 2022-10-16 LAB — SURGICAL PCR SCREEN
MRSA, PCR: NEGATIVE
Staphylococcus aureus: NEGATIVE

## 2022-10-16 NOTE — Progress Notes (Signed)
PCP - Azalia Bilis with [facility]ie [facility] at [facility] - Denies  PPM/ICD - Deneis Device Orders -  Rep Notified -   Chest x-ray - NI  EKG - NI Stress Test - Yes here at this hospital years ago no f/u needed ECHO - Denies Cardiac Cath - Denies  Sleep Study - Home study negative  DM -- Denies  Blood Thinner Instructions:Denies Aspirin Instructions:Denies  ERAS Protcol -Yes PRE-SURGERY Ensure given   COVID TEST- No   Anesthesia review No:   Patient denies shortness of breath, fever, cough and chest pain at PAT appointment   All instructions explained to the patient, with a verbal understanding of the material. Patient agrees to go over the instructions while at home for a better understanding.  The opportunity to ask questions was provided.

## 2022-10-18 ENCOUNTER — Other Ambulatory Visit: Payer: Self-pay

## 2022-10-18 ENCOUNTER — Inpatient Hospital Stay (HOSPITAL_COMMUNITY): Payer: 59

## 2022-10-18 ENCOUNTER — Inpatient Hospital Stay (HOSPITAL_COMMUNITY): Payer: 59 | Admitting: Certified Registered"

## 2022-10-18 ENCOUNTER — Inpatient Hospital Stay (HOSPITAL_BASED_OUTPATIENT_CLINIC_OR_DEPARTMENT_OTHER): Payer: 59 | Admitting: Certified Registered"

## 2022-10-18 ENCOUNTER — Encounter (HOSPITAL_COMMUNITY): Payer: Self-pay | Admitting: Orthopedic Surgery

## 2022-10-18 ENCOUNTER — Ambulatory Visit (HOSPITAL_COMMUNITY)
Admission: RE | Disposition: A | Discharge status: Home / Self Care | Payer: Self-pay | Source: Home / Self Care | Attending: Orthopedic Surgery

## 2022-10-18 ENCOUNTER — Observation Stay (HOSPITAL_COMMUNITY)
Admission: RE | Admit: 2022-10-18 | Discharge: 2022-10-19 | Disposition: A | Discharge status: Home / Self Care | Payer: 59 | Attending: Orthopedic Surgery | Admitting: Orthopedic Surgery

## 2022-10-18 DIAGNOSIS — Z981 Arthrodesis status: Secondary | ICD-10-CM | POA: Insufficient documentation

## 2022-10-18 DIAGNOSIS — M4807 Spinal stenosis, lumbosacral region: Principal | ICD-10-CM | POA: Insufficient documentation

## 2022-10-18 DIAGNOSIS — Z87891 Personal history of nicotine dependence: Secondary | ICD-10-CM | POA: Diagnosis not present

## 2022-10-18 DIAGNOSIS — M4326 Fusion of spine, lumbar region: Secondary | ICD-10-CM

## 2022-10-18 DIAGNOSIS — M5416 Radiculopathy, lumbar region: Secondary | ICD-10-CM | POA: Diagnosis not present

## 2022-10-18 DIAGNOSIS — Y752 Prosthetic and other implants, materials and neurological devices associated with adverse incidents: Secondary | ICD-10-CM | POA: Insufficient documentation

## 2022-10-18 DIAGNOSIS — T85193A Other mechanical complication of implanted electronic neurostimulator, generator, initial encounter: Secondary | ICD-10-CM | POA: Insufficient documentation

## 2022-10-18 DIAGNOSIS — T85192A Other mechanical complication of implanted electronic neurostimulator (electrode) of spinal cord, initial encounter: Secondary | ICD-10-CM | POA: Diagnosis not present

## 2022-10-18 DIAGNOSIS — Z4542 Encounter for adjustment and management of neuropacemaker (brain) (peripheral nerve) (spinal cord): Secondary | ICD-10-CM

## 2022-10-18 DIAGNOSIS — M4317 Spondylolisthesis, lumbosacral region: Secondary | ICD-10-CM | POA: Diagnosis not present

## 2022-10-18 DIAGNOSIS — M4316 Spondylolisthesis, lumbar region: Secondary | ICD-10-CM | POA: Diagnosis not present

## 2022-10-18 DIAGNOSIS — M48061 Spinal stenosis, lumbar region without neurogenic claudication: Secondary | ICD-10-CM | POA: Diagnosis not present

## 2022-10-18 HISTORY — PX: TRANSFORAMINAL LUMBAR INTERBODY FUSION (TLIF) WITH PEDICLE SCREW FIXATION 1 LEVEL: SHX6141

## 2022-10-18 HISTORY — PX: SPINAL CORD STIMULATOR BATTERY EXCHANGE: SHX6202

## 2022-10-18 LAB — ABO/RH: ABO/RH(D): A POS

## 2022-10-18 SURGERY — TRANSFORAMINAL LUMBAR INTERBODY FUSION (TLIF) WITH PEDICLE SCREW FIXATION 1 LEVEL
Anesthesia: General

## 2022-10-18 MED ORDER — CHLORHEXIDINE GLUCONATE 0.12 % MT SOLN: 15.0000 mL | Freq: Once | OROMUCOSAL | Status: AC

## 2022-10-18 MED ORDER — SODIUM CHLORIDE 0.9% FLUSH: 3.0000 mL | INTRAVENOUS | Status: DC | PRN

## 2022-10-18 MED ORDER — PROPOFOL 10 MG/ML IV BOLUS
INTRAVENOUS | Status: AC
  Filled 2022-10-18: qty 20

## 2022-10-18 MED ORDER — ROCURONIUM BROMIDE 10 MG/ML (PF) SYRINGE
PREFILLED_SYRINGE | INTRAVENOUS | Status: DC | PRN
  Administered 2022-10-18: 50 mg via INTRAVENOUS
  Administered 2022-10-18: 100 mg via INTRAVENOUS
  Administered 2022-10-18: 20 mg via INTRAVENOUS

## 2022-10-18 MED ORDER — PROGESTERONE MICRONIZED 100 MG PO CAPS: 100.0000 mg | ORAL_CAPSULE | Freq: Every day | ORAL | Status: DC

## 2022-10-18 MED ORDER — LACTATED RINGERS IV SOLN: INTRAVENOUS | Status: DC

## 2022-10-18 MED ORDER — SENNOSIDES-DOCUSATE SODIUM 8.6-50 MG PO TABS: 1.0000 | ORAL_TABLET | Freq: Every evening | ORAL | Status: DC | PRN

## 2022-10-18 MED ORDER — SODIUM CHLORIDE 0.9% FLUSH
3.0000 mL | Freq: Two times a day (BID) | INTRAVENOUS | Status: DC
  Administered 2022-10-18: 3 mL via INTRAVENOUS

## 2022-10-18 MED ORDER — ONDANSETRON HCL 4 MG/2ML IJ SOLN: 4.0000 mg | Freq: Four times a day (QID) | INTRAMUSCULAR | Status: DC | PRN

## 2022-10-18 MED ORDER — METHYLENE BLUE 1 % INJ SOLN
INTRAVENOUS | Status: AC
  Filled 2022-10-18: qty 10

## 2022-10-18 MED ORDER — KETOROLAC TROMETHAMINE 0.5 % OP SOLN
OPHTHALMIC | Status: AC
  Administered 2022-10-18: 1 [drp]
  Filled 2022-10-18: qty 5

## 2022-10-18 MED ORDER — ONDANSETRON HCL 4 MG/2ML IJ SOLN: 4.0000 mg | Freq: Once | INTRAMUSCULAR | Status: DC | PRN

## 2022-10-18 MED ORDER — MIDAZOLAM HCL 2 MG/2ML IJ SOLN
INTRAMUSCULAR | Status: DC | PRN
  Administered 2022-10-18: 2 mg via INTRAVENOUS

## 2022-10-18 MED ORDER — CLONAZEPAM 0.25 MG PO TBDP
0.7500 mg | ORAL_TABLET | Freq: Every day | ORAL | Status: DC
  Administered 2022-10-18: 0.75 mg via ORAL
  Filled 2022-10-18: qty 3

## 2022-10-18 MED ORDER — PHENYLEPHRINE 80 MCG/ML (10ML) SYRINGE FOR IV PUSH (FOR BLOOD PRESSURE SUPPORT)
PREFILLED_SYRINGE | INTRAVENOUS | Status: DC | PRN
  Administered 2022-10-18 (×2): 160 ug via INTRAVENOUS
  Administered 2022-10-18: 240 ug via INTRAVENOUS
  Administered 2022-10-18: 160 ug via INTRAVENOUS

## 2022-10-18 MED ORDER — LIDOCAINE 2% (20 MG/ML) 5 ML SYRINGE
INTRAMUSCULAR | Status: DC | PRN
  Administered 2022-10-18: 60 mg via INTRAVENOUS

## 2022-10-18 MED ORDER — PANTOPRAZOLE SODIUM 40 MG PO TBEC
40.0000 mg | DELAYED_RELEASE_TABLET | Freq: Two times a day (BID) | ORAL | Status: DC
  Administered 2022-10-18 – 2022-10-19 (×2): 40 mg via ORAL
  Filled 2022-10-18 (×2): qty 1

## 2022-10-18 MED ORDER — BUPIVACAINE-EPINEPHRINE 0.25% -1:200000 IJ SOLN
INTRAMUSCULAR | Status: DC | PRN
  Administered 2022-10-18: 20 mL
  Administered 2022-10-18: 10 mL

## 2022-10-18 MED ORDER — DEXAMETHASONE SODIUM PHOSPHATE 10 MG/ML IJ SOLN
INTRAMUSCULAR | Status: DC | PRN
  Administered 2022-10-18: 10 mg via INTRAVENOUS

## 2022-10-18 MED ORDER — ONDANSETRON HCL 4 MG PO TABS: 4.0000 mg | ORAL_TABLET | Freq: Four times a day (QID) | ORAL | Status: DC | PRN

## 2022-10-18 MED ORDER — SUGAMMADEX SODIUM 200 MG/2ML IV SOLN
INTRAVENOUS | Status: DC | PRN
  Administered 2022-10-18: 200 mg via INTRAVENOUS

## 2022-10-18 MED ORDER — AMISULPRIDE (ANTIEMETIC) 5 MG/2ML IV SOLN: 10.0000 mg | Freq: Once | INTRAVENOUS | Status: DC | PRN

## 2022-10-18 MED ORDER — DULOXETINE HCL 30 MG PO CPEP
60.0000 mg | ORAL_CAPSULE | ORAL | Status: DC
  Administered 2022-10-19: 60 mg via ORAL
  Filled 2022-10-18: qty 2

## 2022-10-18 MED ORDER — ZOLPIDEM TARTRATE 5 MG PO TABS: 10.0000 mg | ORAL_TABLET | Freq: Every day | ORAL | Status: DC

## 2022-10-18 MED ORDER — POTASSIUM CHLORIDE IN NACL 20-0.9 MEQ/L-% IV SOLN: INTRAVENOUS | Status: DC

## 2022-10-18 MED ORDER — 0.9 % SODIUM CHLORIDE (POUR BTL) OPTIME
TOPICAL | Status: DC | PRN
  Administered 2022-10-18: 1000 mL

## 2022-10-18 MED ORDER — HYDROMORPHONE HCL 1 MG/ML IJ SOLN
0.2500 mg | INTRAMUSCULAR | Status: DC | PRN
  Administered 2022-10-18 (×3): 0.5 mg via INTRAVENOUS

## 2022-10-18 MED ORDER — METHOCARBAMOL 1000 MG/10ML IJ SOLN: 500.0000 mg | Freq: Four times a day (QID) | INTRAVENOUS | Status: DC | PRN

## 2022-10-18 MED ORDER — MELATONIN 5 MG PO TABS
10.0000 mg | ORAL_TABLET | Freq: Every day | ORAL | Status: DC
  Administered 2022-10-18: 10 mg via ORAL
  Filled 2022-10-18: qty 2

## 2022-10-18 MED ORDER — ROCURONIUM BROMIDE 10 MG/ML (PF) SYRINGE
PREFILLED_SYRINGE | INTRAVENOUS | Status: AC
  Filled 2022-10-18: qty 20

## 2022-10-18 MED ORDER — MENTHOL 3 MG MT LOZG: 1.0000 | LOZENGE | OROMUCOSAL | Status: DC | PRN

## 2022-10-18 MED ORDER — HYDROMORPHONE HCL 1 MG/ML IJ SOLN
INTRAMUSCULAR | Status: AC
  Filled 2022-10-18: qty 0.5

## 2022-10-18 MED ORDER — VANCOMYCIN HCL IN DEXTROSE 1-5 GM/200ML-% IV SOLN
INTRAVENOUS | Status: AC
  Filled 2022-10-18: qty 200

## 2022-10-18 MED ORDER — ONDANSETRON HCL 4 MG/2ML IJ SOLN
INTRAMUSCULAR | Status: DC | PRN
  Administered 2022-10-18: 4 mg via INTRAVENOUS

## 2022-10-18 MED ORDER — ALUM & MAG HYDROXIDE-SIMETH 200-200-20 MG/5ML PO SUSP: 30.0000 mL | Freq: Four times a day (QID) | ORAL | Status: DC | PRN

## 2022-10-18 MED ORDER — BUPIVACAINE LIPOSOME 1.3 % IJ SUSP
INTRAMUSCULAR | Status: DC | PRN
  Administered 2022-10-18: 20 mL

## 2022-10-18 MED ORDER — FLEET ENEMA 7-19 GM/118ML RE ENEM: 1.0000 | ENEMA | Freq: Once | RECTAL | Status: DC | PRN

## 2022-10-18 MED ORDER — VANCOMYCIN HCL IN DEXTROSE 1-5 GM/200ML-% IV SOLN
1000.0000 mg | INTRAVENOUS | Status: AC
  Administered 2022-10-18: 1000 mg via INTRAVENOUS

## 2022-10-18 MED ORDER — PROPOFOL 1000 MG/100ML IV EMUL
INTRAVENOUS | Status: AC
  Filled 2022-10-18: qty 100

## 2022-10-18 MED ORDER — ACETAMINOPHEN 650 MG RE SUPP: 650.0000 mg | RECTAL | Status: DC | PRN

## 2022-10-18 MED ORDER — DOCUSATE SODIUM 100 MG PO CAPS
100.0000 mg | ORAL_CAPSULE | Freq: Two times a day (BID) | ORAL | Status: DC
  Administered 2022-10-18 – 2022-10-19 (×2): 100 mg via ORAL
  Filled 2022-10-18 (×2): qty 1

## 2022-10-18 MED ORDER — HYDROCODONE-ACETAMINOPHEN 5-325 MG PO TABS: 1.0000 | ORAL_TABLET | ORAL | Status: DC | PRN

## 2022-10-18 MED ORDER — MIDAZOLAM HCL 2 MG/2ML IJ SOLN
INTRAMUSCULAR | Status: AC
  Filled 2022-10-18: qty 2

## 2022-10-18 MED ORDER — HYDROMORPHONE HCL 1 MG/ML IJ SOLN
INTRAMUSCULAR | Status: AC
  Filled 2022-10-18: qty 1

## 2022-10-18 MED ORDER — PHENYLEPHRINE HCL-NACL 20-0.9 MG/250ML-% IV SOLN
INTRAVENOUS | Status: AC
  Filled 2022-10-18: qty 250

## 2022-10-18 MED ORDER — METHOCARBAMOL 500 MG PO TABS
500.0000 mg | ORAL_TABLET | Freq: Four times a day (QID) | ORAL | Status: DC | PRN
  Administered 2022-10-18 – 2022-10-19 (×3): 500 mg via ORAL
  Filled 2022-10-18 (×3): qty 1

## 2022-10-18 MED ORDER — OXYCODONE HCL 5 MG/5ML PO SOLN: 5.0000 mg | Freq: Once | ORAL | Status: DC | PRN

## 2022-10-18 MED ORDER — OXYCODONE-ACETAMINOPHEN 5-325 MG PO TABS
1.0000 | ORAL_TABLET | ORAL | Status: DC | PRN
  Administered 2022-10-18 – 2022-10-19 (×6): 2 via ORAL
  Filled 2022-10-18 (×6): qty 2

## 2022-10-18 MED ORDER — PROPOFOL 10 MG/ML IV BOLUS
INTRAVENOUS | Status: DC | PRN
  Administered 2022-10-18: 150 mg via INTRAVENOUS

## 2022-10-18 MED ORDER — BUPIVACAINE LIPOSOME 1.3 % IJ SUSP
INTRAMUSCULAR | Status: AC
  Filled 2022-10-18: qty 20

## 2022-10-18 MED ORDER — THROMBIN 20000 UNITS EX KIT
PACK | CUTANEOUS | Status: AC
  Filled 2022-10-18: qty 1

## 2022-10-18 MED ORDER — PHENYLEPHRINE HCL-NACL 20-0.9 MG/250ML-% IV SOLN
INTRAVENOUS | Status: DC | PRN
  Administered 2022-10-18: 20 ug/min via INTRAVENOUS

## 2022-10-18 MED ORDER — FENTANYL CITRATE (PF) 250 MCG/5ML IJ SOLN
INTRAMUSCULAR | Status: AC
  Filled 2022-10-18: qty 5

## 2022-10-18 MED ORDER — VANCOMYCIN HCL 1500 MG/300ML IV SOLN
1500.0000 mg | Freq: Once | INTRAVENOUS | Status: AC
  Administered 2022-10-18: 1500 mg via INTRAVENOUS
  Filled 2022-10-18: qty 300

## 2022-10-18 MED ORDER — VARENICLINE TARTRATE 1 MG PO TABS
1.0000 mg | ORAL_TABLET | Freq: Two times a day (BID) | ORAL | Status: DC
  Administered 2022-10-18 – 2022-10-19 (×2): 1 mg via ORAL
  Filled 2022-10-18 (×3): qty 1

## 2022-10-18 MED ORDER — POVIDONE-IODINE 7.5 % EX SOLN
Freq: Once | CUTANEOUS | Status: DC
  Filled 2022-10-18: qty 118

## 2022-10-18 MED ORDER — KETAMINE HCL 10 MG/ML IJ SOLN
INTRAMUSCULAR | Status: DC | PRN
  Administered 2022-10-18: 20 mg via INTRAVENOUS
  Administered 2022-10-18 (×2): 10 mg via INTRAVENOUS
  Administered 2022-10-18: 20 mg via INTRAVENOUS

## 2022-10-18 MED ORDER — OXYCODONE HCL 5 MG PO TABS: 5.0000 mg | ORAL_TABLET | Freq: Once | ORAL | Status: DC | PRN

## 2022-10-18 MED ORDER — ZOLPIDEM TARTRATE 5 MG PO TABS: 5.0000 mg | ORAL_TABLET | Freq: Every evening | ORAL | Status: DC | PRN

## 2022-10-18 MED ORDER — SODIUM CHLORIDE 0.9 % IV SOLN: 250.0000 mL | INTRAVENOUS | Status: DC

## 2022-10-18 MED ORDER — PHENOL 1.4 % MT LIQD: 1.0000 | OROMUCOSAL | Status: DC | PRN

## 2022-10-18 MED ORDER — BISACODYL 5 MG PO TBEC: 5.0000 mg | DELAYED_RELEASE_TABLET | Freq: Every day | ORAL | Status: DC | PRN

## 2022-10-18 MED ORDER — CHLORHEXIDINE GLUCONATE 0.12 % MT SOLN
OROMUCOSAL | Status: AC
  Administered 2022-10-18: 15 mL via OROMUCOSAL
  Filled 2022-10-18: qty 15

## 2022-10-18 MED ORDER — ORAL CARE MOUTH RINSE: 15.0000 mL | Freq: Once | OROMUCOSAL | Status: AC

## 2022-10-18 MED ORDER — BUPIVACAINE-EPINEPHRINE (PF) 0.25% -1:200000 IJ SOLN
INTRAMUSCULAR | Status: AC
  Filled 2022-10-18: qty 30

## 2022-10-18 MED ORDER — ACETAMINOPHEN 325 MG PO TABS: 650.0000 mg | ORAL_TABLET | ORAL | Status: DC | PRN

## 2022-10-18 MED ORDER — THROMBIN 20000 UNITS EX SOLR: CUTANEOUS | Status: DC | PRN

## 2022-10-18 MED ORDER — KETAMINE HCL 50 MG/5ML IJ SOSY
PREFILLED_SYRINGE | INTRAMUSCULAR | Status: AC
  Filled 2022-10-18: qty 10

## 2022-10-18 MED ORDER — HYDROMORPHONE HCL 1 MG/ML IJ SOLN
INTRAMUSCULAR | Status: DC | PRN
  Administered 2022-10-18 (×2): .5 mg via INTRAVENOUS

## 2022-10-18 MED ORDER — CLONAZEPAM 0.5 MG PO TABS: 1.0000 mg | ORAL_TABLET | Freq: Two times a day (BID) | ORAL | Status: DC | PRN

## 2022-10-18 MED ORDER — FENTANYL CITRATE (PF) 250 MCG/5ML IJ SOLN
INTRAMUSCULAR | Status: DC | PRN
  Administered 2022-10-18: 100 ug via INTRAVENOUS
  Administered 2022-10-18 (×2): 50 ug via INTRAVENOUS

## 2022-10-18 MED ORDER — PROGESTERONE MICRONIZED 100 MG PO CAPS
100.0000 mg | ORAL_CAPSULE | Freq: Every day | ORAL | Status: DC
  Administered 2022-10-18 – 2022-10-19 (×2): 100 mg via ORAL
  Filled 2022-10-18 (×2): qty 1

## 2022-10-18 MED ORDER — MORPHINE SULFATE (PF) 2 MG/ML IV SOLN
1.0000 mg | INTRAVENOUS | Status: DC | PRN
  Administered 2022-10-18: 2 mg via INTRAVENOUS
  Filled 2022-10-18: qty 1

## 2022-10-18 MED ORDER — BUDESONIDE 3 MG PO CPEP
3.0000 mg | ORAL_CAPSULE | Freq: Two times a day (BID) | ORAL | Status: DC
  Administered 2022-10-18: 3 mg via ORAL
  Filled 2022-10-18 (×3): qty 1

## 2022-10-18 MED ORDER — ACETAMINOPHEN 500 MG PO TABS
1000.0000 mg | ORAL_TABLET | Freq: Once | ORAL | Status: AC
  Administered 2022-10-18: 1000 mg via ORAL
  Filled 2022-10-18: qty 2

## 2022-10-18 MED ORDER — FESOTERODINE FUMARATE ER 4 MG PO TB24
4.0000 mg | ORAL_TABLET | Freq: Every day | ORAL | Status: DC
  Administered 2022-10-18 – 2022-10-19 (×2): 4 mg via ORAL
  Filled 2022-10-18 (×2): qty 1

## 2022-10-18 SURGICAL SUPPLY — 89 items
BAG COUNTER SPONGE SURGICOUNT (BAG) ×2 IMPLANT
BENZOIN TINCTURE PRP APPL 2/3 (GAUZE/BANDAGES/DRESSINGS) ×2 IMPLANT
BLADE CLIPPER SURG (BLADE) IMPLANT
BUR PRESCISION 1.7 ELITE (BURR) ×2 IMPLANT
BUR ROUND FLUTED 5 RND (BURR) ×2 IMPLANT
BUR ROUND PRECISION 4.0 (BURR) IMPLANT
BUR SABER RD CUTTING 3.0 (BURR) IMPLANT
CAGE SABLE 10X26 15D (Cage) IMPLANT
CANNULA GRAFT BNE VG PRE-FILL (Bone Implant) IMPLANT
CNTNR URN SCR LID CUP LEK RST (MISCELLANEOUS) ×2 IMPLANT
CONT SPEC 4OZ STRL OR WHT (MISCELLANEOUS) ×2
COVER BACK TABLE 60X90IN (DRAPES) ×2 IMPLANT
COVER MAYO STAND STRL (DRAPES) ×4 IMPLANT
COVER SURGICAL LIGHT HANDLE (MISCELLANEOUS) ×2 IMPLANT
DISPENSER GRAFT BNE VG (MISCELLANEOUS) IMPLANT
DISPENSER VIVIGEN BONE GRAFT (MISCELLANEOUS) ×2 IMPLANT
DRAIN CHANNEL 15F RND FF W/TCR (WOUND CARE) IMPLANT
DRAPE C-ARM 42X72 X-RAY (DRAPES) ×2 IMPLANT
DRAPE C-ARMOR (DRAPES) IMPLANT
DRAPE INCISE IOBAN 66X45 STRL (DRAPES) IMPLANT
DRAPE POUCH INSTRU U-SHP 10X18 (DRAPES) ×2 IMPLANT
DRAPE SURG 17X23 STRL (DRAPES) ×8 IMPLANT
DURAPREP 26ML APPLICATOR (WOUND CARE) ×2 IMPLANT
ELECT BLADE 4.0 EZ CLEAN MEGAD (MISCELLANEOUS) ×2
ELECT CAUTERY BLADE 6.4 (BLADE) ×2 IMPLANT
ELECT REM PT RETURN 9FT ADLT (ELECTROSURGICAL) ×2
ELECTRODE BLDE 4.0 EZ CLN MEGD (MISCELLANEOUS) ×2 IMPLANT
ELECTRODE REM PT RTRN 9FT ADLT (ELECTROSURGICAL) ×2 IMPLANT
EVACUATOR SILICONE 100CC (DRAIN) IMPLANT
FILTER STRAW FLUID ASPIR (MISCELLANEOUS) ×2 IMPLANT
GAUZE 4X4 16PLY ~~LOC~~+RFID DBL (SPONGE) ×2 IMPLANT
GAUZE SPONGE 4X4 12PLY STRL (GAUZE/BANDAGES/DRESSINGS) ×2 IMPLANT
GAUZE SPONGE 4X4 12PLY STRL LF (GAUZE/BANDAGES/DRESSINGS) IMPLANT
GLOVE BIO SURGEON STRL SZ7 (GLOVE) ×2 IMPLANT
GLOVE BIO SURGEON STRL SZ8 (GLOVE) ×2 IMPLANT
GLOVE BIOGEL PI IND STRL 7.0 (GLOVE) ×2 IMPLANT
GLOVE BIOGEL PI IND STRL 8 (GLOVE) ×2 IMPLANT
GLOVE SURG ENC MOIS LTX SZ6.5 (GLOVE) ×2 IMPLANT
GOWN STRL REUS W/ TWL LRG LVL3 (GOWN DISPOSABLE) ×4 IMPLANT
GOWN STRL REUS W/ TWL XL LVL3 (GOWN DISPOSABLE) ×2 IMPLANT
GOWN STRL REUS W/TWL LRG LVL3 (GOWN DISPOSABLE) ×4
GOWN STRL REUS W/TWL XL LVL3 (GOWN DISPOSABLE) ×2
GRAFT BONE CANNULA VIVIGEN 3 (Bone Implant) ×6 IMPLANT
IV CATH 14GX2 1/4 (CATHETERS) ×2 IMPLANT
KIT BASIN OR (CUSTOM PROCEDURE TRAY) ×2 IMPLANT
KIT POSITION SURG JACKSON T1 (MISCELLANEOUS) ×2 IMPLANT
KIT TURNOVER KIT B (KITS) ×2 IMPLANT
MARKER SKIN DUAL TIP RULER LAB (MISCELLANEOUS) ×4 IMPLANT
NDL 18GX1X1/2 (RX/OR ONLY) (NEEDLE) ×2 IMPLANT
NDL 22X1.5 STRL (OR ONLY) (MISCELLANEOUS) ×4 IMPLANT
NDL HYPO 25GX1X1/2 BEV (NEEDLE) ×2 IMPLANT
NDL SPNL 18GX3.5 QUINCKE PK (NEEDLE) ×4 IMPLANT
NEEDLE 18GX1X1/2 (RX/OR ONLY) (NEEDLE) IMPLANT
NEEDLE 22X1.5 STRL (OR ONLY) (MISCELLANEOUS) ×4 IMPLANT
NEEDLE HYPO 25GX1X1/2 BEV (NEEDLE) ×2 IMPLANT
NEEDLE SPNL 18GX3.5 QUINCKE PK (NEEDLE) ×4 IMPLANT
NS IRRIG 1000ML POUR BTL (IV SOLUTION) ×2 IMPLANT
PACK LAMINECTOMY ORTHO (CUSTOM PROCEDURE TRAY) ×2 IMPLANT
PACK UNIVERSAL I (CUSTOM PROCEDURE TRAY) ×2 IMPLANT
PAD ARMBOARD 7.5X6 YLW CONV (MISCELLANEOUS) ×4 IMPLANT
PATTIES SURGICAL .5 X1 (DISPOSABLE) ×2 IMPLANT
PATTIES SURGICAL .5X1.5 (GAUZE/BANDAGES/DRESSINGS) ×2 IMPLANT
ROD PRE BENT EXPEDIUM 35MM (Rod) IMPLANT
SCREW CORTICAL VIPER 7X35 (Screw) IMPLANT
SCREW SET SINGLE INNER (Screw) IMPLANT
SCREW VIPER CORT FIX 6X35 (Screw) IMPLANT
SPONGE INTESTINAL PEANUT (DISPOSABLE) ×2 IMPLANT
SPONGE SURGIFOAM ABS GEL 100 (HEMOSTASIS) ×2 IMPLANT
STRIP CLOSURE SKIN 1/2X4 (GAUZE/BANDAGES/DRESSINGS) ×4 IMPLANT
SURGIFLO W/THROMBIN 8M KIT (HEMOSTASIS) IMPLANT
SUT MNCRL AB 4-0 PS2 18 (SUTURE) ×2 IMPLANT
SUT VIC AB 0 CT1 18XCR BRD 8 (SUTURE) ×2 IMPLANT
SUT VIC AB 0 CT1 8-18 (SUTURE) ×4
SUT VIC AB 1 CT1 18XCR BRD 8 (SUTURE) ×2 IMPLANT
SUT VIC AB 1 CT1 8-18 (SUTURE) ×2
SUT VIC AB 2-0 CT2 18 VCP726D (SUTURE) ×2 IMPLANT
SYR 20ML LL LF (SYRINGE) ×4 IMPLANT
SYR BULB IRRIG 60ML STRL (SYRINGE) ×2 IMPLANT
SYR CONTROL 10ML LL (SYRINGE) ×4 IMPLANT
SYR TB 1ML LUER SLIP (SYRINGE) ×2 IMPLANT
TAP EXPEDIUM DL 4.35 (INSTRUMENTS) IMPLANT
TAP EXPEDIUM DL 5.0 (INSTRUMENTS) IMPLANT
TAP EXPEDIUM DL 6.0 (INSTRUMENTS) IMPLANT
TAP EXPEDIUM DL 7.0 (INSTRUMENTS) ×2
TAP EXPEDIUM DL 7X2 (INSTRUMENTS) IMPLANT
TAPE CLOTH SOFT 2X10 (GAUZE/BANDAGES/DRESSINGS) IMPLANT
TRAY FOLEY MTR SLVR 16FR STAT (SET/KITS/TRAYS/PACK) ×2 IMPLANT
WATER STERILE IRR 1000ML POUR (IV SOLUTION) ×2 IMPLANT
YANKAUER SUCT BULB TIP NO VENT (SUCTIONS) ×2 IMPLANT

## 2022-10-18 NOTE — Op Note (Signed)
PATIENT NAME: Melinda Obrien   MEDICAL RECORD NO.:   [id number]   DATE OF BIRTH: [birth year]   DATE OF PROCEDURE: 10/18/2022                               OPERATIVE REPORT   PREOPERATIVE DIAGNOSES: 1.  Left-sided lumbar radiculopathy 2.  L5-S1 spondylolisthesis 3.  Spinal stenosis, L5-S1 4.  Status post L4-5 posterior spinal fusion 5.  Retained, nonfunctioning spinal cord stimulator leads and battery   POSTOPERATIVE DIAGNOSES: 1.  Left-sided lumbar radiculopathy 2.  L5-S1 spondylolisthesis 3.  Spinal stenosis, L5-S1 4.  Status post L4-5 posterior spinal fusion 5.  Retained, nonfunctioning spinal cord stimulator leads and battery   PROCEDURES: 1. Left-sided L5-S1 transforaminal lumbar interbody fusion. 2. Right-sided L5-S1 posterolateral fusion. 3.  L5-S1 decompression, including a bilateral facetectomy (partial on the right, and a full facetectomy on the left) 4. Insertion of interbody device x1 (globus expandable     intervertebral spacer). 5. Placement of posterior instrumentation at L5, S1 bilaterally. 6.  Removal of spinal cord stimulator leads 7.  Removal of spinal cord stimulator battery 8.  Use of local autograft. 9.  Use of morselized allograft - ViviGen. 10. Intraoperative use of fluoroscopy.   SURGEON:  Phylliss Bob, MD.   ASSISTANTPricilla Holm, PA-C.   ANESTHESIA:  General endotracheal anesthesia.   COMPLICATIONS:  None.   DISPOSITION:  Stable.   ESTIMATED BLOOD LOSS:   100cc   INDICATIONS FOR SURGERY:  Briefly, Melinda Obrien is a pleasant 61 year old female who did present to me with severe and ongoing pain in the left leg.  She also had ongoing pain in her back.  I did feel that her symptoms were secondary to the findings noted above.  In addition to her back and leg symptoms, she also did have a spinal cord stimulator and battery placed in the past.  This went on to be nonfunctioning, and she did wish to have this removed.  We did have an  extensive discussion about surgery, and she did elect to proceed with the procedure noted above.      OPERATIVE DETAILS:  On 10/18/2022, the patient was brought to surgery and general endotracheal anesthesia was administered.  The patient was placed prone on a well-padded flat Jackson bed with a spinal frame.  Antibiotics were given and a time-out procedure was performed. The back was prepped and draped in the usual fashion.  At this point, a midline incision was made over the lower thoracic region, at which point the anchored percutaneous spinal cord stimulator leads were identified.  The sutures overlying the anchors were cut, and the percutaneous leads were uneventfully removed, from cephalad to caudad, through the wound.  At this point, the percutaneous leads were transected, and the upper portion of the leads were removed.  I then turned my attention towards the region of the spinal cord stimulator battery, overlying the patient's left flank.  The scar overlying the battery was reutilized, and the spinal cord stimulator battery was identified and removed.  The lower aspect of the leads were delivered through the wound and removed with the battery.  At this point, the wounds overlying the lower thoracic region and spinal cord stimulator battery were thoroughly irrigated with normal saline.  The wounds were then closed using 0 Vicryl, followed by 2-0 Vicryl, followed by 4-0 Monocryl.  Benzoin and Steri-Strips were then applied.  I then turned my  attention to the patient's L5-S1 decompression and fusion. A midline incision was made overlying the L5-S1 intervertebral space.  The fascia was incised at the midline.  The paraspinal musculature was bluntly swept laterally.  Anatomic landmarks for the pedicles were exposed. Using fluoroscopy, I did cannulate the L5 and S1 pedicles bilaterally, using a medial to lateral cortical trajectory technique.  On the right side, the posterolateral gutter and right  facet joint at L5/S1 was decorticated and a 6 x 35 mm screw was placed through the L5 pedicle, and a 7 x 35 mm screw was placed through the S1 pedicle.  A 35-mm rod was placed and distraction was applied across the rod on the right side.  On the left side, the cannulated pedicle holes were filled with bone wax.  I then proceeded with the decompressive aspect of the procedure.  A partial facetectomy was initially performed bilaterally, decompressing the right and left lateral recess.  Then, on the left side, in the region of the L5-S1 facet joint, the facet joint was removed in its entirety.  Of note, there was very significant facet hypertrophy, extending significantly into the lateral recess.  This was found to be compressing the left lateral recess and traversing left S1 nerve significantly.  The facet hypertrophy however was removed in its entirety, entirely decompressing the spinal canal.  At this point, the traversing left S1 nerve was gently medially retracted, after which point I did perform an annulotomy at the posterolateral aspect of the L5/S1 intervertebral space.  I then used a series of curettes and pituitary rongeurs to perform a thorough and complete intervertebral diskectomy.  The intervertebral space was then liberally packed with autograft as well as allograft in the form of ViviGen, as was the appropriate-sized intervertebral spacer.  The spacer was then tamped into position in the usual fashion, and expanded to approximately 13.5 mm in height.  I did elect to use a spacer with 15 degrees of lordosis, per my preoperative templating. I was very pleased with the press-fit of the spacer.  I then placed a 6 x 35 mm screw on the left at L5 and a 7 x 35 mm screw at S1.  A 35-mm rod was then placed and caps were placed. The distraction was then released on the contralateral right side.  All 4 caps were then locked.  The wound was copiously irrigated with a total of approximately 3 L prior  to placing the bone graft.  Additional autograft and allograft were then packed into the posterolateral gutter on the right side to help aid in the L5-S1 fusion.  The wound was explored for any undue bleeding and there was no substantial bleeding encountered.  Gel-Foam was placed over the laminectomy site.  The wound was then closed in layers using #1 Vicryl followed by 2-0 Vicryl, followed by 4-0 Monocryl.  Benzoin and Steri-Strips were applied followed by sterile dressing.  Of note, Pricilla Holm, PA-C was my assistant throughout surgery, and did aid in retraction, suctioning, placement of the hardware, the decompression, removal of the spinal cord stimulator leads and battery, and closure.       Phylliss Bob, MD

## 2022-10-18 NOTE — H&P (Signed)
PREOPERATIVE H&P  Chief Complaint: Left leg pain, retained spinal cord stimulator and battery  HPI: Melinda Obrien is a 61 y.o. female who presents with ongoing pain in the left leg pain. Patient also has a retained spinal cord stimulator and battery that she wishes to have removed.  CT myelogram reveals stenosis and instability and L5/S1  Patient has failed multiple forms of conservative care and continues to have pain (see office notes for additional details regarding the patient's full course of treatment)  Past Medical History:  Diagnosis Date   ADHD (attention deficit hyperactivity disorder)    Anxiety    Arthritis    Back pain, chronic    Bipolar 1 disorder ([place])    per patient reassessed and no longer needs treatment for   Bronchitis, acute    Depression    Past Surgical History:  Procedure Laterality Date   APPENDECTOMY     ARCUATE KERATECTOMY     patient has never had eye surgery   BACK SURGERY  12/10/2008   lumb fusion   KNEE ARTHROSCOPY WITH LATERAL MENISECTOMY Right 10/26/2013   Procedure: KNEE ARTHROSCOPY WITH LATERAL MENISECTOMY;  Surgeon: Kerin Salen, MD;  Location: [facility];  Service: Orthopedics;  Laterality: Right;  Partial medial and lateral menisectomies and chondroplasty patella   SPINAL CORD STIMULATOR IMPLANT Left    Dr. Vira Blanco   TUBAL LIGATION     Social History   Socioeconomic History   Marital status: Married    Spouse name: Audry Pili   Number of children: 3   Years of education: Not on file   Highest education level: Not on file  Occupational History   Not on file  Tobacco Use   Smoking status: Former    Packs/day: 1.00    Types: Cigarettes    Quit date: 09/2022    Years since quitting: 0.1   Smokeless tobacco: Never   Tobacco comments:    Has cut back to 1-2 a day.  Vaping Use   Vaping Use: Never used  Substance and Sexual Activity   Alcohol use: No    Alcohol/week: 0.0 standard drinks of alcohol    Drug use: No   Sexual activity: Not Currently  Other Topics Concern   Not on file  Social History Narrative   Not on file   Social Determinants of Health   Financial Resource Strain: Not on file  Food Insecurity: Not on file  Transportation Needs: Not on file  Physical Activity: Not on file  Stress: Not on file  Social Connections: Not on file   Family History  Problem Relation Age of Onset   Heart failure Father    Cancer Other    Heart failure Other    Alcohol abuse Maternal Aunt    Alcohol abuse Paternal Aunt    Alcohol abuse Maternal Uncle    Alcohol abuse Paternal Uncle    Bipolar disorder Cousin    Alcohol abuse Cousin    Allergies  Allergen Reactions   Amoxicillin Hives   Penicillins Hives   Sulfa Antibiotics Itching   Topiramate     Other reaction(s): GI upset   Prior to Admission medications   Medication Sig Start Date End Date Taking? Authorizing Provider  budesonide (ENTOCORT EC) 3 MG 24 hr capsule Take 3 mg by mouth in the morning and at bedtime. 07/02/22  Yes [provider]  clonazePAM (KLONOPIN) 0.5 MG tablet Take 0.75 mg by mouth at bedtime. 09/17/22  Yes [provider]  diphenhydramine-acetaminophen (TYLENOL PM) 25-500 MG TABS tablet Take 4 tablets by mouth at bedtime as needed (sleep/pain).   Yes [provider]  estradiol (VIVELLE-DOT) 0.0375 MG/24HR Place 1 patch onto the skin 2 (two) times a week. 12/23/17  Yes [provider]  HYDROcodone-acetaminophen (NORCO) 10-325 MG tablet Take 1 tablet by mouth every 6 (six) hours as needed for moderate pain. 06/28/22  Yes [provider]  ibuprofen (ADVIL) 200 MG tablet Take 1,000 mg by mouth every 6 (six) hours as needed for moderate pain.   Yes [provider]  Melatonin 10 MG TABS Take 10 mg by mouth at bedtime.   Yes [provider]  pantoprazole (PROTONIX) 40 MG tablet Take 40 mg by mouth 2 (two) times daily. 10/01/17  Yes [provider]   progesterone (PROMETRIUM) 100 MG capsule Take 100 mg by mouth at bedtime. 03/02/15  Yes [provider]  solifenacin (VESICARE) 10 MG tablet Take 10 mg by mouth at bedtime.   Yes [provider]  tiZANidine (ZANAFLEX) 4 MG tablet Take 12 mg by mouth at bedtime.   Yes [provider]  varenicline (CHANTIX) 1 MG tablet Take 1 mg by mouth 2 (two) times daily.   Yes [provider]  zolpidem (AMBIEN) 10 MG tablet Take 1 tablet (10 mg total) by mouth at bedtime as needed for sleep. Patient taking differently: Take 10 mg by mouth at bedtime. 07/17/18  Yes Eksir, Richard Miu, MD  clonazePAM (KLONOPIN) 1 MG tablet Take 1 tablet (1 mg total) by mouth 2 (two) times daily. Patient not taking: Reported on 10/10/2022 07/17/18   Aundra Dubin, MD  DULoxetine (CYMBALTA) 60 MG capsule Take 1 capsule (60 mg total) by mouth every morning. Patient not taking: Reported on 10/10/2022 07/17/18   Aundra Dubin, MD     All other systems have been reviewed and were otherwise negative with the exception of those mentioned in the HPI and as above.  Physical Exam: Vitals:   10/18/22 0629  BP: 132/73  Pulse: 85  Resp: 18  Temp: 98.3 F (36.8 C)  SpO2: 97%    Body mass index is 35.11 kg/m.  General: Alert, no acute distress Cardiovascular: No pedal edema Respiratory: No cyanosis, no use of accessory musculature Skin: No lesions in the area of chief complaint Neurologic: Sensation intact distally Psychiatric: Patient is competent for consent with normal mood and affect Lymphatic: No axillary or cervical lymphadenopathy   Assessment/Plan: Ongoing chronic left leg pain due to L5-S1 instability in the form of an L5-S1 spondylolisthesis, and facet hypertrophy. Retained spinal cord stimulator leads and battery Plan for Procedure(s): LEFT-SIDED LUMBAR 5- SACRUM 1 TRANSFORAMINAL LUMBAR INTERBODY FUSION AND DECOMPRESSION WITH INSTRUMENTATION AND ALLOGRAFT REMOVAL OF  SPINAL CORD STIMULATOR AND PERCUTANEOUS LEADS   Norva Karvonen, MD 10/18/2022 6:34 AM

## 2022-10-18 NOTE — Transfer of Care (Signed)
Immediate Anesthesia Transfer of Care Note  Patient: Melinda Obrien  Procedure(s) Performed: LEFT-SIDED LUMBAR 5- SACRUM 1 TRANSFORAMINAL LUMBAR INTERBODY FUSION AND DECOMPRESSION WITH INSTRUMENTATION AND ALLOGRAFT (Left) REMOVAL OF SPINAL CORD STIMULATOR AND PERCUTANEOUS LEADS  Patient Location: PACU  Anesthesia Type:General  Level of Consciousness: awake and patient cooperative  Airway & Oxygen Therapy: Patient Spontanous Breathing and Patient connected to face mask  Post-op Assessment: Report given to RN, Post -op Vital signs reviewed and stable, and Patient moving all extremities  Post vital signs: Reviewed and stable  Last Vitals:  Vitals Value Taken Time  BP 131/68 10/18/22 1202  Temp    Pulse 110 10/18/22 1206  Resp 16 10/18/22 1206  SpO2 96 % 10/18/22 1206  Vitals shown include unvalidated device data.  Last Pain:  Vitals:   10/18/22 0655  TempSrc:   PainSc: 2       Patients Stated Pain Goal: 2 (83/09/40 7680)  Complications: No notable events documented.

## 2022-10-18 NOTE — Progress Notes (Signed)
Pharmacy Antibiotic Note  Melinda Obrien is a 61 y.o. female admitted on 10/18/2022 with surgical prophylaxis.  Pharmacy has been consulted for vancomycin dosing.  Plan: Vancomycin 1500 mg IV x 1 dose, 12 hours post periop dose. No drain noted. Pharmacy will sign off   Height: '5\' 5"'$  (165.1 cm) Weight: 95.7 kg (211 lb) IBW/kg (Calculated) : 57  Temp (24hrs), Avg:98 F (36.7 C), Min:97.8 F (36.6 C), Max:98.3 F (36.8 C)  Recent Labs  Lab 10/16/22 1500  WBC 11.8*    CrCl cannot be calculated (Patient's most recent lab result is older than the maximum 21 days allowed.).    Allergies  Allergen Reactions   Amoxicillin Hives   Penicillins Hives   Sulfa Antibiotics Itching   Topiramate     Other reaction(s): GI upset      Thank you for allowing [place] to participate in this patients care. Jens Som, PharmD 10/18/2022 1:43 PM  **Pharmacist phone directory can be found on [email or web address] listed under [place]**

## 2022-10-18 NOTE — Anesthesia Procedure Notes (Signed)
Procedure Name: Intubation Date/Time: 10/18/2022 8:53 AM  Performed by: Elvin So, CRNAPre-anesthesia Checklist: Patient identified, Emergency Drugs available, Suction available and Patient being monitored Patient Re-evaluated:Patient Re-evaluated prior to induction Oxygen Delivery Method: Circle System Utilized Preoxygenation: Pre-oxygenation with 100% oxygen Induction Type: IV induction Ventilation: Mask ventilation without difficulty Laryngoscope Size: Glidescope and 3 Grade View: Grade I Tube type: Oral Tube size: 7.0 mm Number of attempts: 1 Airway Equipment and Method: Stylet and Oral airway Placement Confirmation: ETT inserted through vocal cords under direct vision, positive ETCO2 and breath sounds checked- equal and bilateral Tube secured with: Tape Dental Injury: Teeth and Oropharynx as per pre-operative assessment

## 2022-10-18 NOTE — Anesthesia Postprocedure Evaluation (Signed)
Anesthesia Post Note  Patient: Melinda Obrien  Procedure(s) Performed: LEFT-SIDED LUMBAR 5- SACRUM 1 TRANSFORAMINAL LUMBAR INTERBODY FUSION AND DECOMPRESSION WITH INSTRUMENTATION AND ALLOGRAFT (Left) REMOVAL OF SPINAL CORD STIMULATOR AND PERCUTANEOUS LEADS     Patient location during evaluation: PACU Anesthesia Type: General Level of consciousness: awake and alert, oriented and patient cooperative Pain management: pain level controlled Vital Signs Assessment: post-procedure vital signs reviewed and stable Respiratory status: spontaneous breathing, nonlabored ventilation and respiratory function stable Cardiovascular status: blood pressure returned to baseline and stable Postop Assessment: no apparent nausea or vomiting Anesthetic complications: no Comments: L eye irritation postop- corneal abrasion order set placed   No notable events documented.  Last Vitals:  Vitals:   10/18/22 1215 10/18/22 1230  BP: 134/79 (!) 144/60  Pulse: 98 (!) 101  Resp: 14 14  Temp:  36.6 C  SpO2: 94% 92%    Last Pain:  Vitals:   10/18/22 1230  TempSrc:   PainSc: [place]

## 2022-10-18 NOTE — Anesthesia Preprocedure Evaluation (Signed)
Anesthesia Evaluation    Reviewed: Allergy & Precautions, Patient's Chart, lab work & pertinent test results  Airway Mallampati: II  TM Distance: >3 FB Neck ROM: Full    Dental no notable dental hx.    Pulmonary former smoker Quit smoking 09/2022   Pulmonary exam normal breath sounds clear to auscultation       Cardiovascular negative cardio ROS Normal cardiovascular exam Rhythm:Regular Rate:Normal     Neuro/Psych  PSYCHIATRIC DISORDERS Anxiety Depression Bipolar Disorder   negative neurological ROS     GI/Hepatic Neg liver ROS,GERD  Medicated and Controlled,,  Endo/Other  negative endocrine ROS    Renal/GU negative Renal ROS  negative genitourinary   Musculoskeletal  (+) Arthritis , Osteoarthritis,  Prior back surgery 2010, spinal cord stimulator    Abdominal   Peds  Hematology negative hematology ROS (+) Hb 12.9   Anesthesia Other Findings   Reproductive/Obstetrics negative OB ROS                             Anesthesia Physical Anesthesia Plan  ASA: 2  Anesthesia Plan: General   Post-op Pain Management: Tylenol PO (pre-op)*, Ketamine IV*, Dilaudid IV and Lidocaine infusion*   Induction: Intravenous  PONV Risk Score and Plan: 3 and Ondansetron, Dexamethasone, Midazolam and Treatment may vary due to age or medical condition  Airway Management Planned: Oral ETT  Additional Equipment: None  Intra-op Plan:   Post-operative Plan: Extubation in OR  Informed Consent: I have reviewed the patients History and Physical, chart, labs and discussed the procedure including the risks, benefits and alternatives for the proposed anesthesia with the patient or authorized representative who has indicated his/her understanding and acceptance.     Dental advisory given  Plan Discussed with: CRNA  Anesthesia Plan Comments:        Anesthesia Quick Evaluation

## 2022-10-19 ENCOUNTER — Other Ambulatory Visit (HOSPITAL_COMMUNITY): Payer: Self-pay

## 2022-10-19 DIAGNOSIS — M4807 Spinal stenosis, lumbosacral region: Secondary | ICD-10-CM | POA: Diagnosis not present

## 2022-10-19 MED ORDER — HYDROCODONE-ACETAMINOPHEN 5-325 MG PO TABS
1.0000 | ORAL_TABLET | ORAL | 0 refills | Status: AC | PRN
  Filled 2022-10-19: qty 30, 5d supply, fill #0

## 2022-10-19 MED ORDER — TIZANIDINE HCL 4 MG PO TABS
4.0000 mg | ORAL_TABLET | Freq: Four times a day (QID) | ORAL | 1 refills | Status: AC | PRN
  Filled 2022-10-19: qty 90, 23d supply, fill #0

## 2022-10-19 MED FILL — Thrombin For Soln Kit 20000 Unit: CUTANEOUS | Qty: 1 | Status: AC

## 2022-10-19 NOTE — Progress Notes (Signed)
    Patient doing well PO day 1 S/P L5-S1 TLIF/PSF with removal of SCS. Pt doing well, has been up and walking with good appetite and NL B/B function. Has not yet had PT/OT.   Physical Exam: Vitals:   10/18/22 2320 10/19/22 0351  BP: (!) 149/74 138/65  Pulse: 95 83  Resp: 18 18  Temp: 98.8 F (37.1 C) 99.2 F (37.3 C)  SpO2: 97% 98%    Dressings in place CDI NVI  POD #1 s/p L5-S1 TLIF/PSF with removal of SCS, doing well   - up with PT/OT, encourage ambulation - Pt prefers norco for pain, tizanidine for muscle spasms - likely d/c home today with f/u in 2 weeks

## 2022-10-19 NOTE — Evaluation (Signed)
Occupational Therapy Evaluation Patient Details Name: Melinda Obrien MRN: [id number] DOB: [birth year] Today's Date: 10/19/2022   History of Present Illness 61 yo female s/p 11/9 L5-S1 TLIF and decompression with allograft with removal of spinal cord stimulator and percutaneous leads. PMH: ADHD, anxiety, arthritis, back pain chronic, bipolar 1 disorder, depression, back surgery 2010 spinal cord stimulator implant   Clinical Impression   Patient is s/p TLIF L5-s1 surgery resulting in functional limitations due to the deficits listed below (see OT problem list). Pt currently feeling very fatigued after two cups of coffee and not sleeping well. Pt educated on adls with back precautions. Pt is able to verbalize but needs correction with functional task applying the information. Pt with min cues to don doff brace. Pt educated on not bending over to retract straps on the bed surface. Pt returned to supine after session to sleep to try to help with recovery from current fatigue prior to home.  Patient will benefit from skilled OT acutely to increase independence and safety with ADLS to allow discharge home.       Recommendations for follow up therapy are one component of a multi-disciplinary discharge planning process, led by the attending physician.  Recommendations may be updated based on patient status, additional functional criteria and insurance authorization.   Follow Up Recommendations  No OT follow up    Assistance Recommended at Discharge PRN  Patient can return home with the following A little help with bathing/dressing/bathroom    Functional Status Assessment  Patient has had a recent decline in their functional status and demonstrates the ability to make significant improvements in function in a reasonable and predictable amount of time.  Equipment Recommendations  None recommended by OT    Recommendations for Other Services       Precautions / Restrictions  Precautions Precautions: Back Precaution Booklet Issued: Yes (comment) Precaution Comments: back handout provided and reviewed for adls Required Braces or Orthoses: Spinal Brace Spinal Brace: Thoracolumbosacral orthotic;Applied in standing position Restrictions Weight Bearing Restrictions: No      Mobility Bed Mobility Overal bed mobility: Needs Assistance Bed Mobility: Supine to Sit     Supine to sit: Min assist     General bed mobility comments: educated on sequence with good return demo    Transfers Overall transfer level: Modified independent                 General transfer comment: cues to scoot to edge and power up      Balance Overall balance assessment: Needs assistance         Standing balance support: No upper extremity supported, During functional activity Standing balance-Leahy Scale: Fair Standing balance comment: noted to have sway and advised to use seat in shower                           ADL either performed or assessed with clinical judgement   ADL Overall ADL's : Needs assistance/impaired Eating/Feeding: Independent   Grooming: Independent   Upper Body Bathing: Independent   Lower Body Bathing: Supervison/ safety   Upper Body Dressing : Independent   Lower Body Dressing: Supervision/safety   Toilet Transfer: Supervision/safety           Functional mobility during ADLs: Supervision/safety General ADL Comments: pt needs cues for bending during ADLS and Iadls. pt advised to squat to help with bed linens instead of hip bending  Back handout provided and reviewed adls in detail.  Pt educated on: clothing between brace, never sleep in brace, set an alarm at night for medication, avoid sitting for long periods of time, correct bed positioning for sleeping, correct sequence for bed mobility, avoiding lifting more than 5 pounds and never wash directly over incision. All education is complete and patient indicates  understanding.    Vision Baseline Vision/History: 1 Wears glasses Additional Comments: pt has 8 pairs of glasses present in the room. pt has one on her head and one on table currently. pt reports needing different strengths for tv vs reading     Perception     Praxis      Pertinent Vitals/Pain Pain Assessment Pain Assessment: No/denies pain     Hand Dominance Right   Extremity/Trunk Assessment Upper Extremity Assessment Upper Extremity Assessment: Defer to OT evaluation   Lower Extremity Assessment Lower Extremity Assessment: Generalized weakness   Cervical / Trunk Assessment Cervical / Trunk Assessment: Back Surgery   Communication Communication Communication: No difficulties   Cognition Arousal/Alertness: Awake/alert Behavior During Therapy: Restless Overall Cognitive Status: Impaired/Different from baseline Area of Impairment: Safety/judgement                         Safety/Judgement: Decreased awareness of deficits     General Comments: pt needs cues to keep back precautions. pt able to state precuations but needs corrections to errors with functional task     General Comments       Exercises     Shoulder Instructions      Home Living Family/patient expects to be discharged to:: Private residence Living Arrangements: Spouse/significant other Available Help at Discharge: Family Type of Home: House Home Access: Stairs to enter Technical brewer of Steps: 3 Entrance Stairs-Rails: Right;Left Home Layout: One level     Bathroom Shower/Tub: Occupational psychologist: [place]: None (possible shower seat but patient unsure)   Additional Comments: has mother with high level dementia coming to stay with her in her house. the house was the mothers home previously so she is very familiar with environment      Prior Functioning/Environment Prior Level of Function : Independent/Modified Independent                         OT Problem List: Decreased strength;Decreased activity tolerance;Impaired balance (sitting and/or standing);Decreased cognition;Decreased knowledge of precautions;Decreased knowledge of use of DME or AE;Decreased safety awareness;Obesity      OT Treatment/Interventions: Self-care/ADL training;Therapeutic exercise;DME and/or AE instruction;Therapeutic activities;Cognitive remediation/compensation;Patient/family education;Balance training    OT Goals(Current goals can be found in the care plan section) Acute Rehab OT Goals Patient Stated Goal: to return home today OT Goal Formulation: With patient Time For Goal Achievement: 11/02/22 Potential to Achieve Goals: Good  OT Frequency: Min 2X/week    Co-evaluation              AM-PAC OT "6 Clicks" Daily Activity     Outcome Measure Help from another person eating meals?: None Help from another person taking care of personal grooming?: None Help from another person toileting, which includes using toliet, bedpan, or urinal?: A Little Help from another person bathing (including washing, rinsing, drying)?: A Little Help from another person to put on and taking off regular upper body clothing?: None Help from another person to put on and taking off regular lower body clothing?: A Little 6 Click Score: 21   End of Session  Equipment Utilized During Treatment: Back brace Nurse Communication: Mobility status;Precautions  Activity Tolerance: Patient tolerated treatment well Patient left: in bed;with call bell/phone within reach  OT Visit Diagnosis: Unsteadiness on feet (R26.81);Muscle weakness (generalized) (M62.81)                Time: 0263-7858 OT Time Calculation (min): 19 min Charges:  OT General Charges $OT Visit: 1 Visit OT Evaluation $OT Eval Moderate Complexity: 1 Mod   Brynn, OTR/L  Acute Rehabilitation Services Office: [phone] .   Jeri Modena 10/19/2022, 9:26 AM

## 2022-10-19 NOTE — Evaluation (Signed)
Physical Therapy Evaluation Patient Details Name: Melinda Obrien MRN: [id number] DOB: 08-14-1961 Today's Date: 10/19/2022  History of Present Illness  61 yo female s/p 11/9 L5-S1 TLIF and decompression with allograft with removal of spinal cord stimulator and percutaneous leads. PMH: ADHD, anxiety, arthritis, back pain chronic, bipolar 1 disorder, depression, back surgery 2010 spinal cord stimulator implant  Clinical Impression  Patient admitted following above procedure. PTA, patient lives with husband who will be there for first 2 days and then mother with dementia will be at the house (more for supervision from patient and for patient). Patient functioning at modI level for mobility with no AD. Able to negotiate stairs to safely access home environment with rails. Educated patient on back precautions, brace wear, and progressive walking program, patient verbalized understanding. Able to recall back precautions from beginning of session. No further skilled PT needs identified acutely. No PT follow up recommended at this time. PT will sign off.        Recommendations for follow up therapy are one component of a multi-disciplinary discharge planning process, led by the attending physician.  Recommendations may be updated based on patient status, additional functional criteria and insurance authorization.  Follow Up Recommendations No PT follow up      Assistance Recommended at Discharge PRN  Patient can return home with the following       Equipment Recommendations None recommended by PT  Recommendations for Other Services       Functional Status Assessment Patient has had a recent decline in their functional status and demonstrates the ability to make significant improvements in function in a reasonable and predictable amount of time.     Precautions / Restrictions Precautions Precautions: Back Precaution Booklet Issued: Yes (comment) Required Braces or Orthoses: Spinal  Brace Spinal Brace: Thoracolumbosacral orthotic;Applied in standing position Restrictions Weight Bearing Restrictions: No      Mobility  Bed Mobility Overal bed mobility: Modified Independent             General bed mobility comments: instructed on log roll technique without use of bed rails    Transfers Overall transfer level: Modified independent Equipment used: None                    Ambulation/Gait Ambulation/Gait assistance: Modified independent (Device/Increase time) Gait Distance (Feet): 300 Feet Assistive device: None Gait Pattern/deviations: WFL(Within Functional Limits) Gait velocity: decreased        Stairs Stairs: Yes Stairs assistance: Supervision Stair Management: One rail Left, Forwards, Alternating pattern Number of Stairs: 5 General stair comments: supervision for safety on stair negotiation  Wheelchair Mobility    Modified Rankin (Stroke Patients Only)       Balance Overall balance assessment: Mild deficits observed, not formally tested                                           Pertinent Vitals/Pain Pain Assessment Pain Assessment: Faces Faces Pain Scale: Hurts a little bit Pain Location: back Pain Descriptors / Indicators: Grimacing Pain Intervention(s): Monitored during session    Home Living Family/patient expects to be discharged to:: Private residence Living Arrangements: Spouse/significant other Available Help at Discharge: Family Type of Home: House Home Access: Stairs to enter Entrance Stairs-Rails: Psychiatric nurse of Steps: 3   Home Layout: One level Home Equipment: None (possible shower seat but patient unsure)  Prior Function Prior Level of Function : Independent/Modified Independent                     Hand Dominance        Extremity/Trunk Assessment   Upper Extremity Assessment Upper Extremity Assessment: Defer to OT evaluation    Lower  Extremity Assessment Lower Extremity Assessment: Generalized weakness    Cervical / Trunk Assessment Cervical / Trunk Assessment: Back Surgery  Communication   Communication: No difficulties  Cognition Arousal/Alertness: Awake/alert Behavior During Therapy: WFL for tasks assessed/performed Overall Cognitive Status: Within Functional Limits for tasks assessed                                          General Comments      Exercises     Assessment/Plan    PT Assessment Patient does not need any further PT services  PT Problem List         PT Treatment Interventions      PT Goals (Current goals can be found in the Care Plan section)  Acute Rehab PT Goals Patient Stated Goal: to go home PT Goal Formulation: All assessment and education complete, DC therapy    Frequency       Co-evaluation               AM-PAC PT "6 Clicks" Mobility  Outcome Measure Help needed turning from your back to your side while in a flat bed without using bedrails?: None Help needed moving from lying on your back to sitting on the side of a flat bed without using bedrails?: None Help needed moving to and from a bed to a chair (including a wheelchair)?: None Help needed standing up from a chair using your arms (e.g., wheelchair or bedside chair)?: None Help needed to walk in hospital room?: None Help needed climbing 3-5 steps with a railing? : A Little 6 Click Score: 23    End of Session Equipment Utilized During Treatment: Back brace Activity Tolerance: Patient tolerated treatment well Patient left: in chair;with call bell/phone within reach Nurse Communication: Mobility status PT Visit Diagnosis: Muscle weakness (generalized) (M62.81)    Time: 2585-2778 PT Time Calculation (min) (ACUTE ONLY): 18 min   Charges:   PT Evaluation $PT Eval Low Complexity: 1 Low          Florella Mcneese A. Gilford Rile PT, DPT Acute Rehabilitation Services Office [phone]   Linna Hoff 10/19/2022, 9:26 AM

## 2022-10-22 ENCOUNTER — Encounter (HOSPITAL_COMMUNITY): Payer: Self-pay | Admitting: Orthopedic Surgery

## 2022-10-24 SURGERY — Surgical Case
Anesthesia: *Unknown

## 2022-10-24 NOTE — Discharge Summary (Signed)
Patient ID: Melinda Obrien MRN: [id number] DOB/AGE: 1961-09-11 61 y.o.  Admit date: 10/18/2022 Discharge date: 10/19/2022  Admission Diagnoses:  Principal Problem:   Radiculopathy, lumbar region   Discharge Diagnoses:  Same  Past Medical History:  Diagnosis Date   ADHD (attention deficit hyperactivity disorder)    Anxiety    Arthritis    Back pain, chronic    Bipolar 1 disorder ([place])    per patient reassessed and no longer needs treatment for   Bronchitis, acute    Depression     Surgeries: Procedure(s): LEFT-SIDED LUMBAR 5- SACRUM 1 TRANSFORAMINAL LUMBAR INTERBODY FUSION AND DECOMPRESSION WITH INSTRUMENTATION AND ALLOGRAFT REMOVAL OF SPINAL CORD STIMULATOR AND PERCUTANEOUS LEADS on 10/18/2022   Consultants: none  Discharged Condition: Improved  Hospital Course: Melinda Obrien is an 61 y.o. female who was admitted 10/18/2022 for operative treatment of Radiculopathy, lumbar region. Patient has severe unremitting pain that affects sleep, daily activities, and work/hobbies. After pre-op clearance the patient was taken to the operating room on 10/18/2022 and underwent  Procedure(s): LEFT-SIDED LUMBAR 5- SACRUM 1 TRANSFORAMINAL LUMBAR INTERBODY FUSION AND DECOMPRESSION WITH INSTRUMENTATION AND ALLOGRAFT REMOVAL OF SPINAL CORD STIMULATOR AND PERCUTANEOUS LEADS.    Patient was given perioperative antibiotics:  Anti-infectives (From admission, onward)    Start     Dose/Rate Route Frequency Ordered Stop   10/18/22 2000  vancomycin (VANCOREADY) IVPB 1500 mg/300 mL        1,500 mg 150 mL/hr over 120 Minutes Intravenous  Once 10/18/22 1339 10/18/22 2132   10/18/22 0615  vancomycin (VANCOCIN) IVPB 1000 mg/200 mL premix        1,000 mg 200 mL/hr over 60 Minutes Intravenous On call to O.R. 10/18/22 0611 10/18/22 0901   10/18/22 0615  vancomycin (VANCOCIN) 1-5 GM/200ML-% IVPB       Note to Pharmacy: Tamsen Snider M: cabinet override      10/18/22 0615 10/18/22 3893         Patient was given sequential compression devices, early ambulation to prevent DVT.  Patient benefited maximally from hospital stay and there were no complications.    Recent vital signs: BP (!) 107/42 (BP Location: Right Arm)   Pulse 70   Temp 98.1 F (36.7 C) (Oral)   Resp 20   Ht '5\' 5"'$  (1.651 m)   Wt 95.7 kg   LMP 12/06/2011   SpO2 100%   BMI 35.11 kg/m    Discharge Medications:   Allergies as of 10/19/2022       Reactions   Amoxicillin Hives   Penicillins Hives   Sulfa Antibiotics Itching   Topiramate    Other reaction(s): GI upset        Medication List     STOP taking these medications    HYDROcodone-acetaminophen 10-325 MG tablet Commonly known as: NORCO Replaced by: HYDROcodone-acetaminophen 5-325 MG tablet       TAKE these medications    budesonide 3 MG 24 hr capsule Commonly known as: ENTOCORT EC Take 3 mg by mouth in the morning and at bedtime.   clonazePAM 1 MG tablet Commonly known as: KLONOPIN Take 1 tablet (1 mg total) by mouth 2 (two) times daily.   clonazePAM 0.5 MG tablet Commonly known as: KLONOPIN Take 0.75 mg by mouth at bedtime.   diphenhydramine-acetaminophen 25-500 MG Tabs tablet Commonly known as: TYLENOL PM Take 4 tablets by mouth at bedtime as needed (sleep/pain).   DULoxetine 60 MG capsule Commonly known as: CYMBALTA Take 1 capsule (60  mg total) by mouth every morning.   estradiol 0.0375 MG/24HR Commonly known as: VIVELLE-DOT Place 1 patch onto the skin 2 (two) times a week.   HYDROcodone-acetaminophen 5-325 MG tablet Commonly known as: NORCO/VICODIN Take 1-2 tablets by mouth every 4 (four) hours as needed for moderate pain ((score 4 to 6)). Replaces: HYDROcodone-acetaminophen 10-325 MG tablet   Melatonin 10 MG Tabs Take 10 mg by mouth at bedtime.   pantoprazole 40 MG tablet Commonly known as: PROTONIX Take 40 mg by mouth 2 (two) times daily.   progesterone 100 MG capsule Commonly known as:  PROMETRIUM Take 100 mg by mouth at bedtime.   solifenacin 10 MG tablet Commonly known as: VESICARE Take 10 mg by mouth at bedtime.   tiZANidine 4 MG tablet Commonly known as: ZANAFLEX Take 12 mg by mouth at bedtime. What changed: Another medication with the same name was added. Make sure you understand how and when to take each.   tiZANidine 4 MG tablet Commonly known as: Zanaflex Take 1 tablet (4 mg total) by mouth every 6 (six) hours as needed for muscle spasms. What changed: You were already taking a medication with the same name, and this prescription was added. Make sure you understand how and when to take each.   varenicline 1 MG tablet Commonly known as: CHANTIX Take 1 mg by mouth 2 (two) times daily.   zolpidem 10 MG tablet Commonly known as: Ambien Take 1 tablet (10 mg total) by mouth at bedtime as needed for sleep. What changed: when to take this        Diagnostic Studies: DG Lumbar Spine 1 View  Result Date: 10/18/2022 CLINICAL DATA:  Elective surgery. Intraoperative localization. EXAM: LUMBAR SPINE - 1 VIEW COMPARISON:  Preoperative imaging. FINDINGS: Single portable cross-table lateral view of the lumbar spine obtained in the operating room. Anterior L4-L5 fusion hardware. Surgical instruments localize posterior to the L2 and L3 spinous processes. Spinal stimulator is partially included. IMPRESSION: Surgical instruments localize posterior to the L2 and L3 spinous processes. Electronically Signed   By: Keith Rake M.D.   On: 10/18/2022 14:09   DG Lumbar Spine 2-3 Views  Result Date: 10/18/2022 CLINICAL DATA:  Elective surgery. EXAM: LUMBAR SPINE - 2-3 VIEW COMPARISON:  Preoperative imaging. FINDINGS: Two fluoroscopic spot views obtained in the operating room. Posterior rod with intrapedicular screw shin L5-S1. There is prior L4-L5 anterior fusion hardware. Spacer at L5-S1. Fluoroscopy time 1 minutes 51 seconds. Dose 98.69 mGy. IMPRESSION: Intraoperative  fluoroscopy during L5-S1 fusion. Electronically Signed   By: Keith Rake M.D.   On: 10/18/2022 14:08   DG C-Arm 1-60 Min-No Report  Result Date: 10/18/2022 Fluoroscopy was utilized by the requesting physician.  No radiographic interpretation.   DG C-Arm 1-60 Min-No Report  Result Date: 10/18/2022 Fluoroscopy was utilized by the requesting physician.  No radiographic interpretation.   DG C-Arm 1-60 Min-No Report  Result Date: 10/18/2022 Fluoroscopy was utilized by the requesting physician.  No radiographic interpretation.   DG C-Arm 1-60 Min-No Report  Result Date: 10/18/2022 Fluoroscopy was utilized by the requesting physician.  No radiographic interpretation.    Disposition: Discharge disposition: 01-Home or Self Care       Discharge Instructions     Discharge patient   Complete by: As directed    Discharge disposition: 01-Home or Self Care   Discharge patient date: 10/19/2022      POD #1 s/p L5-S1 TLIF/PSF with removal of SCS, doing well    - up with  PT/OT, encourage ambulation - Pt prefers norco for pain, tizanidine for muscle spasm -Scripts for pain sent to pharmacy electronically  -D/C instructions sheet printed and in chart -D/C today  -F/U in office 2 weeks   Signed: Lennie Muckle Shanette Tamargo 10/24/2022, 1:26 PM

## 2022-10-31 ENCOUNTER — Encounter (HOSPITAL_COMMUNITY): Payer: Self-pay | Admitting: Orthopedic Surgery

## 2023-02-01 ENCOUNTER — Other Ambulatory Visit: Payer: Self-pay

## 2023-02-01 ENCOUNTER — Emergency Department (HOSPITAL_BASED_OUTPATIENT_CLINIC_OR_DEPARTMENT_OTHER)
Admission: EM | Admit: 2023-02-01 | Discharge: 2023-02-01 | Disposition: A | Discharge status: Home / Self Care | Payer: 59 | Attending: Emergency Medicine | Admitting: Emergency Medicine

## 2023-02-01 ENCOUNTER — Encounter (HOSPITAL_BASED_OUTPATIENT_CLINIC_OR_DEPARTMENT_OTHER): Payer: Self-pay

## 2023-02-01 ENCOUNTER — Emergency Department (HOSPITAL_BASED_OUTPATIENT_CLINIC_OR_DEPARTMENT_OTHER): Payer: 59

## 2023-02-01 DIAGNOSIS — Z79899 Other long term (current) drug therapy: Secondary | ICD-10-CM | POA: Insufficient documentation

## 2023-02-01 DIAGNOSIS — R519 Headache, unspecified: Secondary | ICD-10-CM | POA: Diagnosis not present

## 2023-02-01 DIAGNOSIS — I10 Essential (primary) hypertension: Secondary | ICD-10-CM | POA: Diagnosis not present

## 2023-02-01 DIAGNOSIS — I159 Secondary hypertension, unspecified: Secondary | ICD-10-CM

## 2023-02-01 DIAGNOSIS — R030 Elevated blood-pressure reading, without diagnosis of hypertension: Secondary | ICD-10-CM | POA: Diagnosis present

## 2023-02-01 LAB — CBC WITH DIFFERENTIAL/PLATELET
Abs Immature Granulocytes: 0.03 10*3/uL (ref 0.00–0.07)
Basophils Absolute: 0.1 10*3/uL (ref 0.0–0.1)
Basophils Relative: 1 %
Eosinophils Absolute: 0.1 10*3/uL (ref 0.0–0.5)
Eosinophils Relative: 1 %
HCT: 37.2 % (ref 36.0–46.0)
Hemoglobin: 12.2 g/dL (ref 12.0–15.0)
Immature Granulocytes: 0 %
Lymphocytes Relative: 46 %
Lymphs Abs: 3.4 10*3/uL (ref 0.7–4.0)
MCH: 27.7 pg (ref 26.0–34.0)
MCHC: 32.8 g/dL (ref 30.0–36.0)
MCV: 84.4 fL (ref 80.0–100.0)
Monocytes Absolute: 0.4 10*3/uL (ref 0.1–1.0)
Monocytes Relative: 5 %
Neutro Abs: 3.5 10*3/uL (ref 1.7–7.7)
Neutrophils Relative %: 47 %
Platelets: 359 10*3/uL (ref 150–400)
RBC: 4.41 MIL/uL (ref 3.87–5.11)
RDW: 13.8 % (ref 11.5–15.5)
WBC: 7.4 10*3/uL (ref 4.0–10.5)
nRBC: 0 % (ref 0.0–0.2)

## 2023-02-01 LAB — BASIC METABOLIC PANEL
Anion gap: 8 (ref 5–15)
BUN: 12 mg/dL (ref 8–23)
CO2: 25 mmol/L (ref 22–32)
Calcium: 9.8 mg/dL (ref 8.9–10.3)
Chloride: 107 mmol/L (ref 98–111)
Creatinine, Ser: 0.84 mg/dL (ref 0.44–1.00)
GFR, Estimated: >60 mL/min (ref >60)
Glucose, Bld: 81 mg/dL (ref 70–99)
Potassium: 3.4 mmol/L — ABNORMAL LOW (ref 3.5–5.1)
Sodium: 140 mmol/L (ref 135–145)

## 2023-02-01 LAB — TROPONIN I (HIGH SENSITIVITY): Troponin I (High Sensitivity): 4 ng/L (ref <18)

## 2023-02-01 MED ORDER — AMLODIPINE BESYLATE 5 MG PO TABS: 5.0000 mg | ORAL_TABLET | Freq: Every day | ORAL | 0 refills | Status: AC

## 2023-02-01 NOTE — Discharge Instructions (Addendum)
Workup today is unremarkable.  Take amlodipine daily.  Follow-up with your primary care doctor.  I recommend charting her blood pressure over the next 2 weeks least twice daily.  Please return if you develop any severe crushing chest pain or strokelike symptoms.

## 2023-02-01 NOTE — ED Provider Notes (Addendum)
[facility] Provider Note   CSN: [id number] Arrival date & time: 02/01/23  1404     History  Chief Complaint  Patient presents with   Hypertension    Melinda Obrien is a 62 y.o. female.  Patient went to primary care doctor today/he will care office today due to having some high blood pressure for the last few weeks.  Consistently above 150/90 she states.  She has had a little bit of headaches on and off for the last few weeks and maybe the last few days.  Has a mild headache now.  Denies any active chest pain or shortness of breath.  Has a history of anxiety and depression and ADHD.  Denies any weakness or numbness or vision changes.  The history is provided by the patient.       Home Medications Prior to Admission medications   Medication Sig Start Date End Date Taking? Authorizing Provider  budesonide (ENTOCORT EC) 3 MG 24 hr capsule Take 3 mg by mouth in the morning and at bedtime. 07/02/22   [provider]  clonazePAM (KLONOPIN) 0.5 MG tablet Take 0.75 mg by mouth at bedtime. 09/17/22   [provider]  clonazePAM (KLONOPIN) 1 MG tablet Take 1 tablet (1 mg total) by mouth 2 (two) times daily. Patient not taking: Reported on 10/10/2022 07/17/18   Aundra Dubin, MD  diphenhydramine-acetaminophen (TYLENOL PM) 25-500 MG TABS tablet Take 4 tablets by mouth at bedtime as needed (sleep/pain).    [provider]  DULoxetine (CYMBALTA) 60 MG capsule Take 1 capsule (60 mg total) by mouth every morning. Patient not taking: Reported on 10/10/2022 07/17/18   Aundra Dubin, MD  estradiol (VIVELLE-DOT) 0.0375 MG/24HR Place 1 patch onto the skin 2 (two) times a week. 12/23/17   [provider]  HYDROcodone-acetaminophen (NORCO/VICODIN) 5-325 MG tablet Take 1-2 tablets by mouth every 4 (four) hours as needed for moderate pain ((score 4 to 6)). 10/19/22   McKenzie, Lennie Muckle, PA-C  Melatonin 10 MG TABS  Take 10 mg by mouth at bedtime.    [provider]  pantoprazole (PROTONIX) 40 MG tablet Take 40 mg by mouth 2 (two) times daily. 10/01/17   [provider]  progesterone (PROMETRIUM) 100 MG capsule Take 100 mg by mouth at bedtime. 03/02/15   [provider]  solifenacin (VESICARE) 10 MG tablet Take 10 mg by mouth at bedtime.    [provider]  tiZANidine (ZANAFLEX) 4 MG tablet Take 12 mg by mouth at bedtime.    [provider]  tiZANidine (ZANAFLEX) 4 MG tablet Take 1 tablet (4 mg total) by mouth every 6 (six) hours as needed for muscle spasms. 10/19/22 10/19/23  McKenzie, Lennie Muckle, PA-C  varenicline (CHANTIX) 1 MG tablet Take 1 mg by mouth 2 (two) times daily.    [provider]  zolpidem (AMBIEN) 10 MG tablet Take 1 tablet (10 mg total) by mouth at bedtime as needed for sleep. Patient taking differently: Take 10 mg by mouth at bedtime. 07/17/18   Eksir, Richard Miu, MD      Allergies    Amoxicillin, Penicillins, Sulfa antibiotics, and Topiramate    Review of Systems   Review of Systems  Physical Exam Updated Vital Signs BP (!) 159/99 (BP Location: Right Arm)   Pulse 89   Temp 98.2 F (36.8 C) (Oral)   Resp 18   Ht '5\' 5"'$  (1.651 m)   Wt 95.7 kg  LMP 12/06/2011   SpO2 100%   BMI 35.11 kg/m  Physical Exam Vitals and nursing note reviewed.  Constitutional:      General: She is not in acute distress.    Appearance: She is well-developed. She is not ill-appearing.  HENT:     Head: Normocephalic and atraumatic.     Nose: Nose normal.     Mouth/Throat:     Mouth: Mucous membranes are moist.  Eyes:     Extraocular Movements: Extraocular movements intact.     Conjunctiva/sclera: Conjunctivae normal.     Pupils: Pupils are equal, round, and reactive to light.  Cardiovascular:     Rate and Rhythm: Normal rate and regular rhythm.     Pulses: Normal pulses.     Heart sounds: Normal heart sounds. No murmur heard. Pulmonary:      Effort: Pulmonary effort is normal. No respiratory distress.     Breath sounds: Normal breath sounds.  Abdominal:     Palpations: Abdomen is soft.     Tenderness: There is no abdominal tenderness.  Musculoskeletal:        General: Normal range of motion.     Cervical back: Neck supple.  Skin:    General: Skin is warm and dry.     Capillary Refill: Capillary refill takes less than 2 seconds.  Neurological:     General: No focal deficit present.     Mental Status: She is alert and oriented to person, place, and time.     Cranial Nerves: No cranial nerve deficit.     Sensory: No sensory deficit.     Motor: No weakness.     Coordination: Coordination normal.     Comments: 5+ out of 5 strength throughout, normal sensation, no drift, normal finger-nose-finger, normal speech, normal visual fields     ED Results / Procedures / Treatments   Labs (all labs ordered are listed, but only abnormal results are displayed) Labs Reviewed  CBC WITH DIFFERENTIAL/PLATELET  BASIC METABOLIC PANEL  TROPONIN I (HIGH SENSITIVITY)    EKG EKG Interpretation  Date/Time:  Friday February 01 2023 14:17:11 EST Ventricular Rate:  82 PR Interval:  187 QRS Duration: 91 QT Interval:  349 QTC Calculation: 408 R Axis:   56 Text Interpretation: Sinus rhythm Confirmed by Ronnald Nian, Melinda Obrien ([id number]) on 02/01/2023 2:21:10 PM  Radiology No results found.  Procedures Procedures    Medications Ordered in ED Medications - No data to display  ED Course/ Medical Decision Making/ A&P                             Medical Decision Making Amount and/or Complexity of Data Reviewed Labs: ordered. Radiology: ordered.  Risk Prescription drug management.   Melinda Obrien is here with high blood pressure mild headache.  Blood pressure 159/99 but otherwise normal vitals.  Neurologically she is intact.  She not on blood thinners.  History of anxiety and depression.  No cardiac risk factors.  Overall she appears  well.  Sent by primary care for evaluation given high blood pressure with headache.  She not having any chest pain.  EKG per my review interpretation shows sinus rhythm.  No ischemic changes.  Overall she is mildly symptomatic with a headache.  Conservatively will get CBC, BMP, troponin head CT and chest x-ray.  If workup is unremarkable anticipate starting her on amlodipine.  Differential likely this is asymptomatic hypertension but will rule out any endorgan damage.  Per my review and interpretation the labs is no significant anemia or electrolyte abnormality or kidney injury or leukocytosis.  Head CT is unremarkable.  Discharged in good condition.  Understands return precautions.  This chart was dictated using voice recognition software.  Despite best efforts to proofread,  errors can occur which can change the documentation meaning.         Final Clinical Impression(s) / ED Diagnoses Final diagnoses:  Secondary hypertension    Rx / DC Orders ED Discharge Orders     None         Lennice Sites, DO 02/01/23 [place], [place], DO 02/01/23 1529

## 2023-02-01 NOTE — ED Triage Notes (Signed)
Patient here POV from Home.  Endorses History of Untreated HTN for approximately 1 Year.   Has been having general malaise and headache intermittently for weeks but over the past two days it has worsened. Some Trouble with Vision as well.   Seen by [facility] today and was noted to have a BP of 190/106 prompting visit.  NAD Noted during Triage. A&Ox4. GCS 15. Ambulatory.

## 2023-06-21 ENCOUNTER — Emergency Department (HOSPITAL_BASED_OUTPATIENT_CLINIC_OR_DEPARTMENT_OTHER): Payer: 59

## 2023-06-21 ENCOUNTER — Emergency Department (HOSPITAL_BASED_OUTPATIENT_CLINIC_OR_DEPARTMENT_OTHER): Payer: 59 | Admitting: Radiology

## 2023-06-21 ENCOUNTER — Emergency Department (HOSPITAL_BASED_OUTPATIENT_CLINIC_OR_DEPARTMENT_OTHER)
Admission: EM | Admit: 2023-06-21 | Discharge: 2023-06-21 | Disposition: A | Discharge status: Home / Self Care | Payer: 59 | Attending: Emergency Medicine | Admitting: Emergency Medicine

## 2023-06-21 ENCOUNTER — Encounter (HOSPITAL_BASED_OUTPATIENT_CLINIC_OR_DEPARTMENT_OTHER): Payer: Self-pay | Admitting: Emergency Medicine

## 2023-06-21 DIAGNOSIS — S40021A Contusion of right upper arm, initial encounter: Secondary | ICD-10-CM | POA: Insufficient documentation

## 2023-06-21 DIAGNOSIS — M549 Dorsalgia, unspecified: Secondary | ICD-10-CM | POA: Insufficient documentation

## 2023-06-21 DIAGNOSIS — S0003XA Contusion of scalp, initial encounter: Secondary | ICD-10-CM | POA: Diagnosis not present

## 2023-06-21 DIAGNOSIS — Z23 Encounter for immunization: Secondary | ICD-10-CM | POA: Diagnosis not present

## 2023-06-21 DIAGNOSIS — S0990XA Unspecified injury of head, initial encounter: Secondary | ICD-10-CM | POA: Diagnosis present

## 2023-06-21 DIAGNOSIS — I10 Essential (primary) hypertension: Secondary | ICD-10-CM | POA: Insufficient documentation

## 2023-06-21 DIAGNOSIS — R55 Syncope and collapse: Secondary | ICD-10-CM | POA: Insufficient documentation

## 2023-06-21 DIAGNOSIS — R600 Localized edema: Secondary | ICD-10-CM | POA: Insufficient documentation

## 2023-06-21 DIAGNOSIS — S301XXA Contusion of abdominal wall, initial encounter: Secondary | ICD-10-CM | POA: Insufficient documentation

## 2023-06-21 DIAGNOSIS — S0993XA Unspecified injury of face, initial encounter: Secondary | ICD-10-CM

## 2023-06-21 DIAGNOSIS — W228XXA Striking against or struck by other objects, initial encounter: Secondary | ICD-10-CM | POA: Insufficient documentation

## 2023-06-21 DIAGNOSIS — S40022A Contusion of left upper arm, initial encounter: Secondary | ICD-10-CM | POA: Insufficient documentation

## 2023-06-21 DIAGNOSIS — Z79899 Other long term (current) drug therapy: Secondary | ICD-10-CM | POA: Insufficient documentation

## 2023-06-21 HISTORY — DX: Pure hypercholesterolemia, unspecified: E78.00

## 2023-06-21 HISTORY — DX: Essential (primary) hypertension: I10

## 2023-06-21 LAB — CBC WITH DIFFERENTIAL/PLATELET
Abs Immature Granulocytes: 0.09 10*3/uL — ABNORMAL HIGH (ref 0.00–0.07)
Basophils Absolute: 0.1 10*3/uL (ref 0.0–0.1)
Basophils Relative: 1 %
Eosinophils Absolute: 0.2 10*3/uL (ref 0.0–0.5)
Eosinophils Relative: 2 %
HCT: 36.9 % (ref 36.0–46.0)
Hemoglobin: 11.9 g/dL — ABNORMAL LOW (ref 12.0–15.0)
Immature Granulocytes: 1 %
Lymphocytes Relative: 35 %
Lymphs Abs: 3.3 10*3/uL (ref 0.7–4.0)
MCH: 28.4 pg (ref 26.0–34.0)
MCHC: 32.2 g/dL (ref 30.0–36.0)
MCV: 88.1 fL (ref 80.0–100.0)
Monocytes Absolute: 0.7 10*3/uL (ref 0.1–1.0)
Monocytes Relative: 7 %
Neutro Abs: 5.2 10*3/uL (ref 1.7–7.7)
Neutrophils Relative %: 54 %
Platelets: 363 10*3/uL (ref 150–400)
RBC: 4.19 MIL/uL (ref 3.87–5.11)
RDW: 14.5 % (ref 11.5–15.5)
WBC: 9.5 10*3/uL (ref 4.0–10.5)
nRBC: 0 % (ref 0.0–0.2)

## 2023-06-21 LAB — URINALYSIS, ROUTINE W REFLEX MICROSCOPIC
Bacteria, UA: NONE SEEN
Bilirubin Urine: NEGATIVE
Glucose, UA: NEGATIVE mg/dL
Ketones, ur: NEGATIVE mg/dL
Leukocytes,Ua: NEGATIVE
Nitrite: NEGATIVE
Specific Gravity, Urine: 1.018 (ref 1.005–1.030)
pH: 6 (ref 5.0–8.0)

## 2023-06-21 LAB — COMPREHENSIVE METABOLIC PANEL
ALT: 40 U/L (ref 0–44)
AST: 36 U/L (ref 15–41)
Albumin: 4.5 g/dL (ref 3.5–5.0)
Alkaline Phosphatase: 68 U/L (ref 38–126)
Anion gap: 9 (ref 5–15)
BUN: 15 mg/dL (ref 8–23)
CO2: 30 mmol/L (ref 22–32)
Calcium: 9.8 mg/dL (ref 8.9–10.3)
Chloride: 97 mmol/L — ABNORMAL LOW (ref 98–111)
Creatinine, Ser: 0.99 mg/dL (ref 0.44–1.00)
GFR, Estimated: >60 mL/min (ref >60)
Glucose, Bld: 65 mg/dL — ABNORMAL LOW (ref 70–99)
Potassium: 4.2 mmol/L (ref 3.5–5.1)
Sodium: 136 mmol/L (ref 135–145)
Total Bilirubin: 0.3 mg/dL (ref 0.3–1.2)
Total Protein: 7.5 g/dL (ref 6.5–8.1)

## 2023-06-21 LAB — D-DIMER, QUANTITATIVE: D-Dimer, Quant: <0.27 ug/mL-FEU (ref 0.00–0.50)

## 2023-06-21 LAB — BRAIN NATRIURETIC PEPTIDE: B Natriuretic Peptide: 54.1 pg/mL (ref 0.0–100.0)

## 2023-06-21 LAB — CBG MONITORING, ED: Glucose-Capillary: 114 mg/dL — ABNORMAL HIGH (ref 70–99)

## 2023-06-21 MED ORDER — TETANUS-DIPHTH-ACELL PERTUSSIS 5-2.5-18.5 LF-MCG/0.5 IM SUSY
0.5000 mL | PREFILLED_SYRINGE | Freq: Once | INTRAMUSCULAR | Status: AC
  Administered 2023-06-21: 0.5 mL via INTRAMUSCULAR
  Filled 2023-06-21: qty 0.5

## 2023-06-21 MED ORDER — IOHEXOL 300 MG/ML  SOLN
100.0000 mL | Freq: Once | INTRAMUSCULAR | Status: AC | PRN
  Administered 2023-06-21: 100 mL via INTRAVENOUS

## 2023-06-21 NOTE — ED Provider Notes (Signed)
Care assumed from previous provider  Note for full HPI.  Plan on following up on CT abdomen pelvis as well as ultrasound Physical Exam  BP (!) 139/92   Pulse 87   Temp 97.8 F (36.6 C)   Resp 17   Ht 5\' 5"  (1.651 m)   Wt 104.3 kg   LMP 12/06/2011   SpO2 94%   BMI 38.27 kg/m   Physical Exam Vitals and nursing note reviewed.  Constitutional:      General: She is not in acute distress.    Appearance: She is well-developed. She is not ill-appearing.  HENT:     Head: Normocephalic.     Comments: Left facial hematoma Eyes:     Pupils: Pupils are equal, round, and reactive to light.  Cardiovascular:     Rate and Rhythm: Normal rate.  Pulmonary:     Effort: No respiratory distress.  Abdominal:     General: There is no distension.  Musculoskeletal:        General: Normal range of motion.     Cervical back: Normal range of motion.  Skin:    General: Skin is warm and dry.  Neurological:     General: No focal deficit present.     Mental Status: She is alert.  Psychiatric:        Mood and Affect: Mood normal.     Procedures  Procedures Labs Reviewed  URINALYSIS, ROUTINE W REFLEX MICROSCOPIC - Abnormal; Notable for the following components:      Result Value   APPearance HAZY (*)    Hgb urine dipstick MODERATE (*)    Protein, ur TRACE (*)    All other components within normal limits  CBC WITH DIFFERENTIAL/PLATELET - Abnormal; Notable for the following components:   Hemoglobin 11.9 (*)    Abs Immature Granulocytes 0.09 (*)    All other components within normal limits  COMPREHENSIVE METABOLIC PANEL - Abnormal; Notable for the following components:   Chloride 97 (*)    Glucose, Bld 65 (*)    All other components within normal limits  CBG MONITORING, ED - Abnormal; Notable for the following components:   Glucose-Capillary 114 (*)    All other components within normal limits  BRAIN NATRIURETIC PEPTIDE  D-DIMER, QUANTITATIVE   US Venous Img Lower Bilateral  Result  Date: 06/21/2023 CLINICAL DATA:  Bilateral lower extremity swelling EXAM: BILATERAL LOWER EXTREMITY VENOUS DOPPLER ULTRASOUND TECHNIQUE: Gray-scale sonography with compression, as well as color and duplex ultrasound, were performed to evaluate the deep venous system(s) from the level of the common femoral vein through the popliteal and proximal calf veins. COMPARISON:  None Available. FINDINGS: VENOUS Normal compressibility of the common femoral, superficial femoral, and popliteal veins, as well as the visualized calf veins. Visualized portions of profunda femoral vein and great saphenous vein unremarkable. No filling defects to suggest DVT on grayscale or color Doppler imaging. Doppler waveforms show normal direction of venous flow, normal respiratory plasticity and response to augmentation. Limited views of the contralateral common femoral vein are unremarkable. OTHER None. Limitations: none IMPRESSION: Negative. Electronically Signed   By: Minerva Fester M.D.   On: 06/21/2023 19:36   CT ABDOMEN PELVIS W CONTRAST  Result Date: 06/21/2023 CLINICAL DATA:  Fall 1 week ago. Right lower back pain. Lower extremity swelling and pain EXAM: CT ABDOMEN AND PELVIS WITH CONTRAST TECHNIQUE: Multidetector CT imaging of the abdomen and pelvis was performed using the standard protocol following bolus administration of intravenous contrast. RADIATION DOSE REDUCTION:  This exam was performed according to the departmental dose-optimization program which includes automated exposure control, adjustment of the mA and/or kV according to patient size and/or use of iterative reconstruction technique. CONTRAST:  OMNIPAQUE IOHEXOL 300 MG/ML  SOLN COMPARISON:  CT abdomen and pelvis 10/17/2017 FINDINGS: Lower chest: No acute abnormality. Hepatobiliary: Hepatic cysts. Liver is otherwise unremarkable. No biliary dilation or radiopaque stones. Pancreas: Unremarkable. Spleen: Unremarkable. Adrenals/Urinary Tract: Stable adrenal glands.  No urinary calculi or hydronephrosis. Unremarkable bladder. Stomach/Bowel: Normal caliber large and small bowel. No bowel wall thickening. The appendix is not visualized. No secondary signs of appendicitis. Stomach is within normal limits. Vascular/Lymphatic: Aortic atherosclerosis. No enlarged abdominal or pelvic lymph nodes. Reproductive: Uterus and bilateral adnexa are unremarkable. Other: No free intraperitoneal fluid or air. Musculoskeletal: No acute fracture.  L4-S1 fusion. IMPRESSION: 1. No acute abnormality in the abdomen or pelvis. Aortic Atherosclerosis (ICD10-I70.0). Electronically Signed   By: Minerva Fester M.D.   On: 06/21/2023 19:01   CT Maxillofacial Wo Contrast  Result Date: 06/21/2023 CLINICAL DATA:  Facial trauma, blunt EXAM: CT HEAD WITHOUT CONTRAST CT MAXILLOFACIAL WITHOUT CONTRAST TECHNIQUE: Multidetector CT imaging of the head and maxillofacial structures were performed using the standard protocol without intravenous contrast. Multiplanar CT image reconstructions of the maxillofacial structures were also generated. RADIATION DOSE REDUCTION: This exam was performed according to the departmental dose-optimization program which includes automated exposure control, adjustment of the mA and/or kV according to patient size and/or use of iterative reconstruction technique. COMPARISON:  02/01/2023 FINDINGS: CT HEAD FINDINGS Brain: No evidence of acute infarction, hemorrhage, hydrocephalus, extra-axial collection or mass lesion/mass effect. Vascular: No hyperdense vessel or unexpected calcification. Skull: Normal. Negative for fracture or focal lesion. Other: Small left supraorbital scalp hematoma. CT MAXILLOFACIAL FINDINGS Osseous: No acute maxillofacial bone fracture. Bony orbital walls are intact. Mandible intact. Temporomandibular joints are aligned without dislocation. Orbits: Negative. No traumatic or inflammatory finding. Sinuses: Right maxillary sinus mucosal thickening with small  air-fluid level. Soft tissues: Small left supraorbital scalp hematoma. IMPRESSION: 1. No acute intracranial abnormality. 2. No acute maxillofacial bone fracture. 3. Small left supraorbital scalp hematoma. 4. Right maxillary sinus mucosal thickening with small air-fluid level. Correlate for acute sinusitis. Electronically Signed   By: Duanne Guess D.O.   On: 06/21/2023 18:45   CT Head Wo Contrast  Result Date: 06/21/2023 CLINICAL DATA:  Facial trauma, blunt EXAM: CT HEAD WITHOUT CONTRAST CT MAXILLOFACIAL WITHOUT CONTRAST TECHNIQUE: Multidetector CT imaging of the head and maxillofacial structures were performed using the standard protocol without intravenous contrast. Multiplanar CT image reconstructions of the maxillofacial structures were also generated. RADIATION DOSE REDUCTION: This exam was performed according to the departmental dose-optimization program which includes automated exposure control, adjustment of the mA and/or kV according to patient size and/or use of iterative reconstruction technique. COMPARISON:  02/01/2023 FINDINGS: CT HEAD FINDINGS Brain: No evidence of acute infarction, hemorrhage, hydrocephalus, extra-axial collection or mass lesion/mass effect. Vascular: No hyperdense vessel or unexpected calcification. Skull: Normal. Negative for fracture or focal lesion. Other: Small left supraorbital scalp hematoma. CT MAXILLOFACIAL FINDINGS Osseous: No acute maxillofacial bone fracture. Bony orbital walls are intact. Mandible intact. Temporomandibular joints are aligned without dislocation. Orbits: Negative. No traumatic or inflammatory finding. Sinuses: Right maxillary sinus mucosal thickening with small air-fluid level. Soft tissues: Small left supraorbital scalp hematoma. IMPRESSION: 1. No acute intracranial abnormality. 2. No acute maxillofacial bone fracture. 3. Small left supraorbital scalp hematoma. 4. Right maxillary sinus mucosal thickening with small air-fluid level. Correlate for  acute  sinusitis. Electronically Signed   By: Duanne Guess D.O.   On: 06/21/2023 18:45   DG Chest 2 View  Result Date: 06/21/2023 CLINICAL DATA:  Lower extremity swelling EXAM: CHEST - 2 VIEW COMPARISON:  02/01/2023 FINDINGS: The heart size and mediastinal contours are within normal limits. Both lungs are clear. No pleural effusion or pneumothorax. The visualized skeletal structures are unremarkable. IMPRESSION: No active cardiopulmonary disease. Electronically Signed   By: Duanne Guess D.O.   On: 06/21/2023 18:37    ED Course / MDM   Clinical Course as of 06/21/23 2029  Fri Jun 21, 2023  1848 Fall on toilet, nodding off episodes vs syncopal episodes. FU on CT, [place]. Suspect sleep apnea per previous provider as cause of her nodding off. If neg dc home [BH]    Clinical Course User Index [BH] Kiaria Quinnell A, PA-C  Seen from previous Clinical research associate.  See note for full HPI.  Patient 62 year old here for evaluation of fall off toilet.  She has been having nodding off episodes.  Patient writer thought possible sleep apnea as cause of her falling asleep episodes.  Patient chronic lower extremity edema which states that was likely due to venous stasis changes.  Labs and imaging personally viewed and interpreted:  No abnormality  Discussed results with patient.  Will have follow-up outpatient  The patient has been appropriately medically screened and/or stabilized in the ED. I have low suspicion for any other emergent medical condition which would require further screening, evaluation or treatment in the ED or require inpatient management.  Patient is hemodynamically stable and in no acute distress.  Patient able to ambulate in department prior to ED.  Evaluation does not show acute pathology that would require ongoing or additional emergent interventions while in the emergency department or further inpatient treatment.  I have discussed the diagnosis with the patient and answered all questions.   Pain is been managed while in the emergency department and patient has no further complaints prior to discharge.  Patient is comfortable with plan discussed in room and is stable for discharge at this time.  I have discussed strict return precautions for returning to the emergency department.  Patient was encouraged to follow-up with PCP/specialist refer to at discharge.   Medical Decision Making Amount and/or Complexity of Data Reviewed External Data Reviewed: labs, radiology, ECG and notes. Labs: ordered. Decision-making details documented in ED Course. Radiology: ordered and independent interpretation performed. Decision-making details documented in ED Course. ECG/medicine tests: ordered and independent interpretation performed. Decision-making details documented in ED Course.  Risk OTC drugs. Prescription drug management. Decision regarding hospitalization. Diagnosis or treatment significantly limited by social determinants of health.          Royden Bulman A, PA-C 06/21/23 2029    Melene Plan, DO 06/21/23 2050

## 2023-06-21 NOTE — ED Notes (Signed)
US at bedside at this time 

## 2023-06-21 NOTE — ED Notes (Signed)
RN reviewed discharge instructions with pt. Pt verbalized understanding and had no further questions. VSS upon discharge.  

## 2023-06-21 NOTE — ED Notes (Signed)
GCS 15

## 2023-06-21 NOTE — ED Provider Notes (Signed)
[facility] EMERGENCY DEPARTMENT AT [facility] Provider Note   CSN: [id number] Arrival date & time: 06/21/23  1454     History  Chief Complaint  Patient presents with   Melinda Obrien is a 62 y.o. female, history of hypertension, bipolar disorder, chronic back pain, who presents to the ED secondary to fall that occurred about a week ago.  She states that she was sitting on the toilet in the middle the night, trying to use the bathroom, when she passed out, and hit her head, on a toilet paper rack.  She states that she did not lose consciousness with this, but she nodded off on the toilet and this happened several times.  She notes that she snores quite a bit at night, and often nods off during the day.  She states that she hit the left eye, and side of her face, is still very swollen and tender.  Denies any changes in vision, or pain with vision.  Also states that she has noticed that she has had bilateral lower extremity swelling, since then her legs have been very tender.  She also has new bruises on her bilateral arms, and abdomen.  Also reports that she has right-sided back pain, that is tender.  Denies any nausea, vomiting, blood in stools.   Denies any recent chest pain, shortness of breath.    Home Medications Prior to Admission medications   Medication Sig Start Date End Date Taking? Authorizing Provider  amLODipine (NORVASC) 5 MG tablet Take 1 tablet (5 mg total) by mouth daily. 02/01/23 03/03/23  Curatolo, Adam, DO  budesonide (ENTOCORT EC) 3 MG 24 hr capsule Take 3 mg by mouth in the morning and at bedtime. 07/02/22   [provider]  clonazePAM (KLONOPIN) 0.5 MG tablet Take 0.75 mg by mouth at bedtime. 09/17/22   [provider]  clonazePAM (KLONOPIN) 1 MG tablet Take 1 tablet (1 mg total) by mouth 2 (two) times daily. Patient not taking: Reported on 10/10/2022 07/17/18   Burnard Leigh, MD  diphenhydramine-acetaminophen (TYLENOL  PM) 25-500 MG TABS tablet Take 4 tablets by mouth at bedtime as needed (sleep/pain).    [provider]  DULoxetine (CYMBALTA) 60 MG capsule Take 1 capsule (60 mg total) by mouth every morning. Patient not taking: Reported on 10/10/2022 07/17/18   Burnard Leigh, MD  estradiol (VIVELLE-DOT) 0.0375 MG/24HR Place 1 patch onto the skin 2 (two) times a week. 12/23/17   [provider]  HYDROcodone-acetaminophen (NORCO/VICODIN) 5-325 MG tablet Take 1-2 tablets by mouth every 4 (four) hours as needed for moderate pain ((score 4 to 6)). 10/19/22   McKenzie, Eilene Ghazi, PA-C  Melatonin 10 MG TABS Take 10 mg by mouth at bedtime.    [provider]  pantoprazole (PROTONIX) 40 MG tablet Take 40 mg by mouth 2 (two) times daily. 10/01/17   [provider]  progesterone (PROMETRIUM) 100 MG capsule Take 100 mg by mouth at bedtime. 03/02/15   [provider]  solifenacin (VESICARE) 10 MG tablet Take 10 mg by mouth at bedtime.    [provider]  tiZANidine (ZANAFLEX) 4 MG tablet Take 12 mg by mouth at bedtime.    [provider]  tiZANidine (ZANAFLEX) 4 MG tablet Take 1 tablet (4 mg total) by mouth every 6 (six) hours as needed for muscle spasms. 10/19/22 10/19/23  McKenzie, Eilene Ghazi, PA-C  varenicline (CHANTIX) 1 MG tablet Take 1 mg by mouth 2 (two)  times daily.    [provider]  zolpidem (AMBIEN) 10 MG tablet Take 1 tablet (10 mg total) by mouth at bedtime as needed for sleep. Patient taking differently: Take 10 mg by mouth at bedtime. 07/17/18   Burnard Leigh, MD      Allergies    Amoxicillin, Penicillins, Sulfa antibiotics, and Topiramate    Review of Systems   Review of Systems  Cardiovascular:  Positive for leg swelling. Negative for chest pain.  Gastrointestinal:  Negative for blood in stool.  Skin:  Positive for wound.    Physical Exam Updated Vital Signs BP (!) 139/92   Pulse 87   Temp 97.8 F (36.6 C)   Resp 17    Ht 5\' 5"  (1.651 m)   Wt 104.3 kg   LMP 12/06/2011   SpO2 94%   BMI 38.27 kg/m  Physical Exam Vitals and nursing note reviewed.  Constitutional:      General: She is not in acute distress.    Appearance: She is well-developed.  HENT:     Head: Normocephalic. No Battle's sign.     Comments: Ecchymosis, edema to left periorbital region, no laceration to the eye, however laceration that is healing to the lateral aspect of the head. Eyes:     Conjunctiva/sclera: Conjunctivae normal.  Cardiovascular:     Rate and Rhythm: Normal rate and regular rhythm.     Heart sounds: No murmur heard.    Comments: No rash noted.  Positive dorsalis pedis pulses bilaterally Pulmonary:     Effort: Pulmonary effort is normal. No respiratory distress.     Breath sounds: Normal breath sounds.  Abdominal:     Palpations: Abdomen is soft.     Tenderness: There is no abdominal tenderness.  Musculoskeletal:        General: No swelling.     Cervical back: Neck supple.     Right lower leg: 1+ Pitting Edema present.     Left lower leg: 1+ Pitting Edema present.     Comments: Tenderness to palpation of right flank without ecchymosis.    Skin:    General: Skin is warm and dry.     Capillary Refill: Capillary refill takes less than 2 seconds.     Comments: Ecchymosis present on the abdomen, and bilateral arms  Neurological:     Mental Status: She is alert.  Psychiatric:        Mood and Affect: Mood normal.     ED Results / Procedures / Treatments   Labs (all labs ordered are listed, but only abnormal results are displayed) Labs Reviewed  URINALYSIS, ROUTINE W REFLEX MICROSCOPIC - Abnormal; Notable for the following components:      Result Value   APPearance HAZY (*)    Hgb urine dipstick MODERATE (*)    Protein, ur TRACE (*)    All other components within normal limits  CBC WITH DIFFERENTIAL/PLATELET - Abnormal; Notable for the following components:   Hemoglobin 11.9 (*)    Abs Immature  Granulocytes 0.09 (*)    All other components within normal limits  COMPREHENSIVE METABOLIC PANEL - Abnormal; Notable for the following components:   Chloride 97 (*)    Glucose, Bld 65 (*)    All other components within normal limits  BRAIN NATRIURETIC PEPTIDE  D-DIMER, QUANTITATIVE    EKG None  Radiology CT Maxillofacial Wo Contrast  Result Date: 06/21/2023 CLINICAL DATA:  Facial trauma, blunt EXAM: CT HEAD WITHOUT CONTRAST CT MAXILLOFACIAL WITHOUT CONTRAST TECHNIQUE:  Multidetector CT imaging of the head and maxillofacial structures were performed using the standard protocol without intravenous contrast. Multiplanar CT image reconstructions of the maxillofacial structures were also generated. RADIATION DOSE REDUCTION: This exam was performed according to the departmental dose-optimization program which includes automated exposure control, adjustment of the mA and/or kV according to patient size and/or use of iterative reconstruction technique. COMPARISON:  02/01/2023 FINDINGS: CT HEAD FINDINGS Brain: No evidence of acute infarction, hemorrhage, hydrocephalus, extra-axial collection or mass lesion/mass effect. Vascular: No hyperdense vessel or unexpected calcification. Skull: Normal. Negative for fracture or focal lesion. Other: Feliciana Narayan left supraorbital scalp hematoma. CT MAXILLOFACIAL FINDINGS Osseous: No acute maxillofacial bone fracture. Bony orbital walls are intact. Mandible intact. Temporomandibular joints are aligned without dislocation. Orbits: Negative. No traumatic or inflammatory finding. Sinuses: Right maxillary sinus mucosal thickening with Ja Pistole air-fluid level. Soft tissues: Kamrin Sibley left supraorbital scalp hematoma. IMPRESSION: 1. No acute intracranial abnormality. 2. No acute maxillofacial bone fracture. 3. Rexford Prevo left supraorbital scalp hematoma. 4. Right maxillary sinus mucosal thickening with Marybell Robards air-fluid level. Correlate for acute sinusitis. Electronically Signed   By: Duanne Guess D.O.   On: 06/21/2023 18:45   CT Head Wo Contrast  Result Date: 06/21/2023 CLINICAL DATA:  Facial trauma, blunt EXAM: CT HEAD WITHOUT CONTRAST CT MAXILLOFACIAL WITHOUT CONTRAST TECHNIQUE: Multidetector CT imaging of the head and maxillofacial structures were performed using the standard protocol without intravenous contrast. Multiplanar CT image reconstructions of the maxillofacial structures were also generated. RADIATION DOSE REDUCTION: This exam was performed according to the departmental dose-optimization program which includes automated exposure control, adjustment of the mA and/or kV according to patient size and/or use of iterative reconstruction technique. COMPARISON:  02/01/2023 FINDINGS: CT HEAD FINDINGS Brain: No evidence of acute infarction, hemorrhage, hydrocephalus, extra-axial collection or mass lesion/mass effect. Vascular: No hyperdense vessel or unexpected calcification. Skull: Normal. Negative for fracture or focal lesion. Other: Lilliemae Fruge left supraorbital scalp hematoma. CT MAXILLOFACIAL FINDINGS Osseous: No acute maxillofacial bone fracture. Bony orbital walls are intact. Mandible intact. Temporomandibular joints are aligned without dislocation. Orbits: Negative. No traumatic or inflammatory finding. Sinuses: Right maxillary sinus mucosal thickening with Tabia Landowski air-fluid level. Soft tissues: Anahit Klumb left supraorbital scalp hematoma. IMPRESSION: 1. No acute intracranial abnormality. 2. No acute maxillofacial bone fracture. 3. Eppie Barhorst left supraorbital scalp hematoma. 4. Right maxillary sinus mucosal thickening with Lorilyn Laitinen air-fluid level. Correlate for acute sinusitis. Electronically Signed   By: Duanne Guess D.O.   On: 06/21/2023 18:45   DG Chest 2 View  Result Date: 06/21/2023 CLINICAL DATA:  Lower extremity swelling EXAM: CHEST - 2 VIEW COMPARISON:  02/01/2023 FINDINGS: The heart size and mediastinal contours are within normal limits. Both lungs are clear. No pleural effusion or  pneumothorax. The visualized skeletal structures are unremarkable. IMPRESSION: No active cardiopulmonary disease. Electronically Signed   By: Duanne Guess D.O.   On: 06/21/2023 18:37    Procedures Procedures    Medications Ordered in ED Medications  Tdap (BOOSTRIX) injection 0.5 mL (0.5 mLs Intramuscular Given 06/21/23 1716)  iohexol (OMNIPAQUE) 300 MG/ML solution 100 mL (100 mLs Intravenous Contrast Given 06/21/23 1834)    ED Course/ Medical Decision Making/ A&P Clinical Course as of 06/21/23 1856  Fri Jun 21, 2023  1848 Fall on toilet, nodding off episodes vs syncopal episodes. FU on CT, [place]. Suspect sleep apnea per previous provider as cause of her nodding off. If neg dc home [BH]    Clinical Course User Index [BH] Henderly, Britni A, PA-C  Medical Decision Making Patient is a 62 year old female, who has been nodding off, several times, during the day, at night fell and hit her left side of her face last week.  Not on any blood thinners.  Will obtain a CT of his head, face, for further evaluation for possible facial fractures.  Also complaining of some right lower back pain, after trauma.  We obtained a CT abdomen pelvis for this.  Given that she has some swelling of her legs, we will also obtain bilateral lower extremity venous ultrasound to rule out a DVT.   Amount and/or Complexity of Data Reviewed Labs: ordered.    Details: No electrolyte abnormalities, some blood in the urine Radiology: ordered.    Details: CT head, face, and chest x-ray all fairly unremarkable except for hematoma noted Discussion of management or test interpretation with external provider(s): Pending CT abdomen pelvis, ultrasound of bilateral lower extremities, Britney PA, to further dispo.  I believe that her syncope/passing out episodes may be secondary to hypoglycemia, she was hypoglycemic here, and states he has not eaten much however.  Versus secondary to sleep apnea, I  recommend she have a sleep test.  Additionally no facial fractures, however updated tetanus given wound to the left side of face, that is healing at this time.  Risk Prescription drug management.    Final Clinical Impression(s) / ED Diagnoses Final diagnoses:  Hematoma of scalp, initial encounter  Facial injury, initial encounter  Syncope, unspecified syncope type  Bilateral lower extremity edema    Rx / DC Orders ED Discharge Orders     None         Dorrie Cocuzza, Harley [place], PA 06/21/23 1859    Melene Plan, DO 06/21/23 1910

## 2023-06-21 NOTE — Discharge Instructions (Addendum)
Please follow-up with your primary care doctor, all of your scans are reassuring today.  Return to the ER if you feel like you are about to pass out, has severe chest pain, or shortness of breath.  I believe that you also will need to follow-up with your primary care doctor for possible sleep study, I think this is likely what is causing you to nod off during the day, and the night.

## 2023-06-21 NOTE — ED Triage Notes (Signed)
Pt arrives pov, steady gait, with c/oo Fall x 1 week pta. Bruising noted to LT eye. Denies thinners. Pt c/o RT lower back pain x 3 or 4 days. Taking tramadol for pain, pain worsens when standing. Endorses bilateral LE swelling and pain

## 2023-06-21 NOTE — ED Notes (Signed)
Patient transported to CT 

## 2023-06-22 LAB — CBG MONITORING, ED: Glucose-Capillary: 138 mg/dL — ABNORMAL HIGH (ref 70–99)

## 2024-03-31 ENCOUNTER — Other Ambulatory Visit (HOSPITAL_BASED_OUTPATIENT_CLINIC_OR_DEPARTMENT_OTHER): Payer: Self-pay | Admitting: Family Medicine

## 2024-03-31 DIAGNOSIS — Z122 Encounter for screening for malignant neoplasm of respiratory organs: Secondary | ICD-10-CM

## 2024-04-10 ENCOUNTER — Encounter (HOSPITAL_COMMUNITY): Payer: Self-pay

## 2024-04-10 ENCOUNTER — Ambulatory Visit (HOSPITAL_COMMUNITY): Attending: Family Medicine

## 2024-05-26 ENCOUNTER — Ambulatory Visit (HOSPITAL_BASED_OUTPATIENT_CLINIC_OR_DEPARTMENT_OTHER)
Admission: RE | Admit: 2024-05-26 | Discharge: 2024-05-26 | Disposition: A | Discharge status: Home / Self Care | Source: Ambulatory Visit | Attending: Family Medicine | Admitting: Family Medicine

## 2024-05-26 DIAGNOSIS — Z122 Encounter for screening for malignant neoplasm of respiratory organs: Secondary | ICD-10-CM | POA: Diagnosis present

## 2024-10-29 ENCOUNTER — Ambulatory Visit: Admitting: Family Medicine
